# Patient Record
Sex: Female | Born: 1995 | Race: White | Hispanic: No | Marital: Married | Smoking: Never smoker
Health system: Southern US, Community
[De-identification: ages and names within clinical notes are randomized; demographics above are authoritative.]

## PROBLEM LIST (undated history)

## (undated) ENCOUNTER — Inpatient Hospital Stay (HOSPITAL_COMMUNITY): Payer: Self-pay

## (undated) DIAGNOSIS — F419 Anxiety disorder, unspecified: Secondary | ICD-10-CM

## (undated) DIAGNOSIS — M419 Scoliosis, unspecified: Secondary | ICD-10-CM

## (undated) DIAGNOSIS — T7840XA Allergy, unspecified, initial encounter: Secondary | ICD-10-CM

## (undated) HISTORY — PX: INCISION AND DRAINAGE: SHX5863

## (undated) HISTORY — PX: OTHER SURGICAL HISTORY: SHX169

## (undated) HISTORY — DX: Allergy, unspecified, initial encounter: T78.40XA

## (undated) HISTORY — PX: BACK SURGERY: SHX140

---
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
## 2015-05-21 ENCOUNTER — Encounter (HOSPITAL_COMMUNITY): Payer: Self-pay | Admitting: *Deleted

## 2015-05-21 ENCOUNTER — Inpatient Hospital Stay (HOSPITAL_COMMUNITY)
Admission: AD | Admit: 2015-05-21 | Discharge: 2015-05-22 | Disposition: A | Discharge status: Home / Self Care | Payer: 59 | Source: Ambulatory Visit | Attending: Obstetrics and Gynecology | Admitting: Obstetrics and Gynecology

## 2015-05-21 DIAGNOSIS — N760 Acute vaginitis: Secondary | ICD-10-CM | POA: Diagnosis not present

## 2015-05-21 DIAGNOSIS — Z3A01 Less than 8 weeks gestation of pregnancy: Secondary | ICD-10-CM | POA: Diagnosis not present

## 2015-05-21 DIAGNOSIS — B373 Candidiasis of vulva and vagina: Secondary | ICD-10-CM | POA: Diagnosis not present

## 2015-05-21 DIAGNOSIS — B9689 Other specified bacterial agents as the cause of diseases classified elsewhere: Secondary | ICD-10-CM | POA: Insufficient documentation

## 2015-05-21 DIAGNOSIS — R109 Unspecified abdominal pain: Secondary | ICD-10-CM | POA: Insufficient documentation

## 2015-05-21 DIAGNOSIS — O98811 Other maternal infectious and parasitic diseases complicating pregnancy, first trimester: Secondary | ICD-10-CM | POA: Insufficient documentation

## 2015-05-21 DIAGNOSIS — O26899 Other specified pregnancy related conditions, unspecified trimester: Secondary | ICD-10-CM

## 2015-05-21 DIAGNOSIS — O23591 Infection of other part of genital tract in pregnancy, first trimester: Secondary | ICD-10-CM | POA: Diagnosis not present

## 2015-05-21 HISTORY — DX: Scoliosis, unspecified: M41.9

## 2015-05-21 HISTORY — DX: Anxiety disorder, unspecified: F41.9

## 2015-05-21 LAB — POCT PREGNANCY, URINE: Preg Test, Ur: POSITIVE — AB

## 2015-05-21 NOTE — MAU Note (Signed)
PT  SAYS SHE STARTED  CRAMPING  ON 6-4     AND  FEELS  SAME .  TOOK TYLENOL-  AT  430 PM  2  TAB  500 MG.-   WITH  RELIEF-  FOR  FEW HRS.       WENT  TO OFFICE ON 6-9-  TO CONFIRM  PREG-   AND  NEXT  APPOINTMENT   IS 6-23.   U/S  FOR  6-23.    LAST   SEX-    6-2   .  NO VAG  BLEEDING.

## 2015-05-21 NOTE — MAU Provider Note (Signed)
History     CSN: [id number]  Arrival date and time: 05/21/15 2317   First Provider Initiated Contact with Patient 05/21/15 2354      No chief complaint on file.  HPI  Ms. DALEYZA GADOMSKI is a 19 y.o. G1P0 at 29w2dwho presents to MAU today with complaint of abdominal pain. The patient states she has had cramping in the lower abdomen since 05/11/15. She denies vaginal bleeding, fever or UTI symptoms. She has had nausea and constipation, but denies vomiting or diarrhea. She endorses a creamy white discharge that has increased in amount recently. She has been taking Tylenol with relief.   OB History    Gravida Para Term Preterm AB TAB SAB Ectopic Multiple Living   1               Past Medical History  Diagnosis Date  . Allergy   . Scoliosis   . Anxiety     Past Surgical History  Procedure Laterality Date  . Scoliosis-harrington rods    . Back surgery    . Incision and drainage      Family History  Problem Relation Age of Onset  . Crohn's disease Mother     History  Substance Use Topics  . Smoking status: Never Smoker   . Smokeless tobacco: Not on file  . Alcohol Use: No    Allergies:  Allergies  Allergen Reactions  . Codeine Rash    Prescriptions prior to admission  Medication Sig Dispense Refill Last Dose  . acetaminophen (TYLENOL) 325 MG tablet Take 650 mg by mouth every 6 (six) hours as needed.   05/21/2015 at 1630  . calcium carbonate (TUMS - DOSED IN MG ELEMENTAL CALCIUM) 500 MG chewable tablet Chew 1 tablet by mouth daily.   05/21/2015 at Unknown time  . sertraline (ZOLOFT) 50 MG tablet Take 50 mg by mouth daily.   Past Month at Unknown time  . etonogestrel (IMPLANON) 68 MG IMPL implant Inject 1 each into the skin once.     .Marland Kitchenipratropium (ATROVENT) 0.03 % nasal spray Place 2 sprays into the nose 2 (two) times daily. 30 mL 0   . PredniSONE 5 MG KIT Take 1 kit (5 mg total) by mouth daily after breakfast. Prednisone 5 mg 6 day dosepack.  Take as directed. 1 kit  0     Review of Systems  Constitutional: Negative for fever and malaise/fatigue.  Gastrointestinal: Positive for abdominal pain. Negative for nausea, vomiting, diarrhea and constipation.  Genitourinary: Negative for dysuria, urgency and frequency.       Neg - vaginal bleeding + vaginal discharge   Physical Exam   Blood pressure 125/66, pulse 76, temperature 97.7 F (36.5 C), temperature source Oral, resp. rate 18, height 5' 1"  (1.549 m), weight 158 lb (71.668 kg), last menstrual period 04/08/2015.  Physical Exam  Nursing note and vitals reviewed. Constitutional: She is oriented to person, place, and time. She appears well-developed and well-nourished. No distress.  HENT:  Head: Normocephalic and atraumatic.  Cardiovascular: Normal rate.   Respiratory: Effort normal.  GI: Soft.  Genitourinary: Uterus is tender (mild). Uterus is not enlarged. Cervix exhibits no motion tenderness, no discharge and no friability. Right adnexum displays tenderness (mild to moderate). Right adnexum displays no mass. Left adnexum displays no mass and no tenderness. No bleeding in the vagina. Vaginal discharge (moderate amount of thick, white discharge noted) found.  Neurological: She is alert and oriented to person, place, and time.  Skin: Skin  is warm and dry. No erythema.  Psychiatric: She has a normal mood and affect.   Results for orders placed or performed during the hospital encounter of 05/21/15 (from the past 24 hour(s))  Urinalysis, Routine w reflex microscopic (not at [facility])     Status: None   Collection Time: 05/21/15 11:45 PM  Result Value Ref Range   Color, Urine YELLOW YELLOW   APPearance CLEAR CLEAR   Specific Gravity, Urine 1.020 1.005 - 1.030   pH 6.0 5.0 - 8.0   Glucose, UA NEGATIVE NEGATIVE mg/dL   Hgb urine dipstick NEGATIVE NEGATIVE   Bilirubin Urine NEGATIVE NEGATIVE   Ketones, ur NEGATIVE NEGATIVE mg/dL   Protein, ur NEGATIVE NEGATIVE mg/dL   Urobilinogen, UA 0.2 0.0 - 1.0  mg/dL   Nitrite NEGATIVE NEGATIVE   Leukocytes, UA NEGATIVE NEGATIVE  Pregnancy, urine POC     Status: Abnormal   Collection Time: 05/21/15 11:49 PM  Result Value Ref Range   Preg Test, Ur POSITIVE (A) NEGATIVE  CBC with Differential/Platelet     Status: Abnormal   Collection Time: 05/22/15 12:00 AM  Result Value Ref Range   WBC 9.9 4.0 - 10.5 K/uL   RBC 4.53 3.87 - 5.11 MIL/uL   Hemoglobin 14.0 12.0 - 15.0 g/dL   HCT 38.2 36.0 - 46.0 %   MCV 84.3 78.0 - 100.0 fL   MCH 30.9 26.0 - 34.0 pg   MCHC 36.6 (H) 30.0 - 36.0 g/dL   RDW 12.2 11.5 - 15.5 %   Platelets 224 150 - 400 K/uL   Neutrophils Relative % 51 43 - 77 %   Lymphocytes Relative 40 12 - 46 %   Monocytes Relative 8 3 - 12 %   Eosinophils Relative 1 0 - 5 %   Basophils Relative 0 0 - 1 %   Band Neutrophils 0 0 - 10 %   Metamyelocytes Relative 0 %   Myelocytes 0 %   Promyelocytes Absolute 0 %   Blasts 0 %   nRBC 0 0 /100 WBC   Other 0 %   Neutro Abs 5.0 1.7 - 7.7 K/uL   Lymphs Abs 4.0 0.7 - 4.0 K/uL   Monocytes Absolute 0.8 0.1 - 1.0 K/uL   Eosinophils Absolute 0.1 0.0 - 0.7 K/uL   Basophils Absolute 0.0 0.0 - 0.1 K/uL  ABO/Rh     Status: None   Collection Time: 05/22/15 12:00 AM  Result Value Ref Range   ABO/RH(D) B POS   hCG, quantitative, pregnancy     Status: Abnormal   Collection Time: 05/22/15 12:00 AM  Result Value Ref Range   hCG, Beta Chain, Quant, S 24421 (H) <5 mIU/mL  Wet prep, genital     Status: Abnormal   Collection Time: 05/22/15 12:33 AM  Result Value Ref Range   Yeast Wet Prep HPF POC NONE SEEN NONE SEEN   Trich, Wet Prep NONE SEEN NONE SEEN   Clue Cells Wet Prep HPF POC FEW (A) NONE SEEN   WBC, Wet Prep HPF POC FEW (A) NONE SEEN   US Ob Comp Less 14 Wks  05/22/2015   CLINICAL DATA:  Acute onset of bilateral pelvic pain and cramping. Initial encounter.  EXAM: OBSTETRIC <14 WK [place] AND TRANSVAGINAL OB US  TECHNIQUE: Both transabdominal and transvaginal ultrasound examinations were performed for  complete evaluation of the gestation as well as the maternal uterus, adnexal regions, and pelvic cul-de-sac. Transvaginal technique was performed to assess early pregnancy.  COMPARISON:  CT of the abdomen and pelvis performed 07/11/2008  FINDINGS: Intrauterine gestational sac: Visualized/normal in shape.  Yolk sac:  Yes  Embryo:  No  Cardiac Activity: N/A  MSD: 1.3 cm   6 w   1  d  Maternal uterus/adnexae: No subchorionic hemorrhage is noted. The uterus is otherwise unremarkable.  The ovaries are within normal limits, though the right ovary is not fully measured on this study. The left ovary measures 3.5 x 2.8 x 2.9 cm. No suspicious adnexal masses are seen. There is no evidence for ovarian torsion.  Trace free fluid is seen within the pelvic cul-de-sac.  IMPRESSION: Single intrauterine gestational sac noted, with a mean sac diameter of 1.3 cm, corresponding to a gestational age of [redacted] weeks 1 day. This matches the gestational age of [redacted] weeks 2 days by LMP, reflecting an estimated date of delivery of January 13, 2016. A yolk sac is seen. The embryo is not yet visualized, within normal limits.   Electronically Signed   By: Garald Balding M.D.   On: 05/22/2015 01:22   US Ob Transvaginal  05/22/2015   CLINICAL DATA:  Acute onset of bilateral pelvic pain and cramping. Initial encounter.  EXAM: OBSTETRIC <14 WK [place] AND TRANSVAGINAL OB US  TECHNIQUE: Both transabdominal and transvaginal ultrasound examinations were performed for complete evaluation of the gestation as well as the maternal uterus, adnexal regions, and pelvic cul-de-sac. Transvaginal technique was performed to assess early pregnancy.  COMPARISON:  CT of the abdomen and pelvis performed 07/11/2008  FINDINGS: Intrauterine gestational sac: Visualized/normal in shape.  Yolk sac:  Yes  Embryo:  No  Cardiac Activity: N/A  MSD: 1.3 cm   6 w   1  d  Maternal uterus/adnexae: No subchorionic hemorrhage is noted. The uterus is otherwise unremarkable.  The ovaries are  within normal limits, though the right ovary is not fully measured on this study. The left ovary measures 3.5 x 2.8 x 2.9 cm. No suspicious adnexal masses are seen. There is no evidence for ovarian torsion.  Trace free fluid is seen within the pelvic cul-de-sac.  IMPRESSION: Single intrauterine gestational sac noted, with a mean sac diameter of 1.3 cm, corresponding to a gestational age of [redacted] weeks 1 day. This matches the gestational age of [redacted] weeks 2 days by LMP, reflecting an estimated date of delivery of January 13, 2016. A yolk sac is seen. The embryo is not yet visualized, within normal limits.   Electronically Signed   By: Garald Balding M.D.   On: 05/22/2015 01:22     MAU Course  Procedures None  MDM +UPT UA, wet prep, GC/chlamydia, CBC, ABO/Rh, quant hCG, HIV, RPR and [place] today to rule out ectopic pregnancy Discussed patient with Dr. Rogue Bussing. Agrees with plan for ectopic work-up and follow-up in the office as scheduled.  150 gm Diflucan and 1000 mg Tylenol given in MAU Assessment and Plan  A: IUGS and YS at 28w1dAbdominal pain in pregnancy, antepartum Yeast vulvovaginitis, clinical Bacterial vaginosis  P: Discharge home Rx for Flagyl given to patient Tylenol PRN for pain First trimester precautions discussed [facility] for routine prenatal care Patient may return to MAU as needed or if her condition were to change or worsen   JLuvenia Redden PA-C  05/22/2015, 2:15 AM

## 2015-05-22 ENCOUNTER — Encounter (HOSPITAL_COMMUNITY): Payer: Self-pay

## 2015-05-22 ENCOUNTER — Inpatient Hospital Stay (HOSPITAL_COMMUNITY): Payer: 59

## 2015-05-22 DIAGNOSIS — A499 Bacterial infection, unspecified: Secondary | ICD-10-CM

## 2015-05-22 DIAGNOSIS — N760 Acute vaginitis: Secondary | ICD-10-CM

## 2015-05-22 LAB — CBC WITH DIFFERENTIAL/PLATELET
BASOS PCT: 0 % (ref 0–1)
Band Neutrophils: 0 % (ref 0–10)
Basophils Absolute: 0 10*3/uL (ref 0.0–0.1)
Blasts: 0 %
EOS ABS: 0.1 10*3/uL (ref 0.0–0.7)
EOS PCT: 1 % (ref 0–5)
HEMATOCRIT: 38.2 % (ref 36.0–46.0)
Hemoglobin: 14 g/dL (ref 12.0–15.0)
LYMPHS PCT: 40 % (ref 12–46)
Lymphs Abs: 4 10*3/uL (ref 0.7–4.0)
MCH: 30.9 pg (ref 26.0–34.0)
MCHC: 36.6 g/dL — ABNORMAL HIGH (ref 30.0–36.0)
MCV: 84.3 fL (ref 78.0–100.0)
Metamyelocytes Relative: 0 %
Monocytes Absolute: 0.8 10*3/uL (ref 0.1–1.0)
Monocytes Relative: 8 % (ref 3–12)
Myelocytes: 0 %
NEUTROS ABS: 5 10*3/uL (ref 1.7–7.7)
NEUTROS PCT: 51 % (ref 43–77)
OTHER: 0 %
Platelets: 224 10*3/uL (ref 150–400)
Promyelocytes Absolute: 0 %
RBC: 4.53 MIL/uL (ref 3.87–5.11)
RDW: 12.2 % (ref 11.5–15.5)
WBC: 9.9 10*3/uL (ref 4.0–10.5)
nRBC: 0 /100 WBC

## 2015-05-22 LAB — URINALYSIS, ROUTINE W REFLEX MICROSCOPIC
BILIRUBIN URINE: NEGATIVE
GLUCOSE, UA: NEGATIVE mg/dL
HGB URINE DIPSTICK: NEGATIVE
KETONES UR: NEGATIVE mg/dL
Leukocytes, UA: NEGATIVE
Nitrite: NEGATIVE
PH: 6 (ref 5.0–8.0)
Protein, ur: NEGATIVE mg/dL
Specific Gravity, Urine: 1.02 (ref 1.005–1.030)
Urobilinogen, UA: 0.2 mg/dL (ref 0.0–1.0)

## 2015-05-22 LAB — WET PREP, GENITAL
Trich, Wet Prep: NONE SEEN
Yeast Wet Prep HPF POC: NONE SEEN

## 2015-05-22 LAB — RPR: RPR Ser Ql: NONREACTIVE

## 2015-05-22 LAB — ABO/RH: ABO/RH(D): B POS

## 2015-05-22 LAB — HIV ANTIBODY (ROUTINE TESTING W REFLEX): HIV Screen 4th Generation wRfx: NONREACTIVE

## 2015-05-22 LAB — HCG, QUANTITATIVE, PREGNANCY: HCG, BETA CHAIN, QUANT, S: 24421 m[IU]/mL — AB (ref <5)

## 2015-05-22 MED ORDER — FLUCONAZOLE 150 MG PO TABS
150.0000 mg | ORAL_TABLET | Freq: Once | ORAL | Status: AC
  Administered 2015-05-22: 150 mg via ORAL
  Filled 2015-05-22: qty 1

## 2015-05-22 MED ORDER — METRONIDAZOLE 500 MG PO TABS: 500.0000 mg | ORAL_TABLET | Freq: Two times a day (BID) | ORAL | Status: DC

## 2015-05-22 MED ORDER — ACETAMINOPHEN 500 MG PO TABS
1000.0000 mg | ORAL_TABLET | Freq: Once | ORAL | Status: AC
  Administered 2015-05-22: 1000 mg via ORAL
  Filled 2015-05-22: qty 2

## 2015-05-22 NOTE — Discharge Instructions (Signed)
First Trimester of Pregnancy The first trimester of pregnancy is from week 1 until the end of week 12 (months 1 through 3). During this time, your baby will begin to develop inside you. At 6-8 weeks, the eyes and face are formed, and the heartbeat can be seen on ultrasound. At the end of 12 weeks, all the baby's organs are formed. Prenatal care is all the medical care you receive before the birth of your baby. Make sure you get good prenatal care and follow all of your doctor's instructions. HOME CARE  Medicines  Take medicine only as told by your doctor. Some medicines are safe and some are not during pregnancy.  Take your prenatal vitamins as told by your doctor.  Take medicine that helps you poop (stool softener) as needed if your doctor says it is okay. Diet  Eat regular, healthy meals.  Your doctor will tell you the amount of weight gain that is right for you.  Avoid raw meat and uncooked cheese.  If you feel sick to your stomach (nauseous) or throw up (vomit):  Eat 4 or 5 small meals a day instead of 3 large meals.  Try eating a few soda crackers.  Drink liquids between meals instead of during meals.  If you have a hard time pooping (constipation):  Eat high-fiber foods like fresh vegetables, fruit, and whole grains.  Drink enough fluids to keep your pee (urine) clear or pale yellow. Activity and Exercise  Exercise only as told by your doctor. Stop exercising if you have cramps or pain in your lower belly (abdomen) or low back.  Try to avoid standing for long periods of time. Move your legs often if you must stand in one place for a long time.  Avoid heavy lifting.  Wear low-heeled shoes. Sit and stand up straight.  You can have sex unless your doctor tells you not to. Relief of Pain or Discomfort  Wear a good support bra if your breasts are sore.  Take warm water baths (sitz baths) to soothe pain or discomfort caused by hemorrhoids. Use hemorrhoid cream if your  doctor says it is okay.  Rest with your legs raised if you have leg cramps or low back pain.  Wear support hose if you have puffy, bulging veins (varicose veins) in your legs. Raise (elevate) your feet for 15 minutes, 3-4 times a day. Limit salt in your diet. Prenatal Care  Schedule your prenatal visits by the twelfth week of pregnancy.  Write down your questions. Take them to your prenatal visits.  Keep all your prenatal visits as told by your doctor. Safety  Wear your seat belt at all times when driving.  Make a list of emergency phone numbers. The list should include numbers for family, friends, the hospital, and police and fire departments. General Tips  Ask your doctor for a referral to a local prenatal class. Begin classes no later than at the start of month 6 of your pregnancy.  Ask for help if you need counseling or help with nutrition. Your doctor can give you advice or tell you where to go for help.  Do not use hot tubs, steam rooms, or saunas.  Do not douche or use tampons or scented sanitary pads.  Do not cross your legs for long periods of time.  Avoid litter boxes and soil used by cats.  Avoid all smoking, herbs, and alcohol. Avoid drugs not approved by your doctor.  Visit your dentist. At home, brush your teeth   with a soft toothbrush. Be gentle when you floss. GET HELP IF:  You are dizzy.  You have mild cramps or pressure in your lower belly.  You have a nagging pain in your belly area.  You continue to feel sick to your stomach, throw up, or have watery poop (diarrhea).  You have a bad smelling fluid coming from your vagina.  You have pain with peeing (urination).  You have increased puffiness (swelling) in your face, hands, legs, or ankles. GET HELP RIGHT AWAY IF:   You have a fever.  You are leaking fluid from your vagina.  You have spotting or bleeding from your vagina.  You have very bad belly cramping or pain.  You gain or lose weight  rapidly.  You throw up blood. It may look like coffee grounds.  You are around people who have German measles, fifth disease, or chickenpox.  You have a very bad headache.  You have shortness of breath.  You have any kind of trauma, such as from a fall or a car accident. Document Released: 05/11/2008 Document Revised: 04/09/2014 Document Reviewed: 10/03/2013 Exit[employer] Patient Information 2015 [employer]. This information is not intended to replace advice given to you by your health care provider. Make sure you discuss any questions you have with your health care provider.  

## 2015-05-23 LAB — GC/CHLAMYDIA PROBE AMP (~~LOC~~) NOT AT ARMC
CHLAMYDIA, DNA PROBE: NEGATIVE
NEISSERIA GONORRHEA: NEGATIVE

## 2015-07-01 LAB — OB RESULTS CONSOLE RUBELLA ANTIBODY, IGM: RUBELLA: IMMUNE

## 2015-07-01 LAB — OB RESULTS CONSOLE ANTIBODY SCREEN: ANTIBODY SCREEN: NEGATIVE

## 2015-07-01 LAB — OB RESULTS CONSOLE RPR: RPR: NONREACTIVE

## 2015-07-01 LAB — OB RESULTS CONSOLE GC/CHLAMYDIA
CHLAMYDIA, DNA PROBE: NEGATIVE
Gonorrhea: NEGATIVE

## 2015-07-01 LAB — OB RESULTS CONSOLE HEPATITIS B SURFACE ANTIGEN: HEP B S AG: NEGATIVE

## 2015-07-01 LAB — OB RESULTS CONSOLE ABO/RH: RH Type: POSITIVE

## 2015-07-01 LAB — OB RESULTS CONSOLE HIV ANTIBODY (ROUTINE TESTING): HIV: NONREACTIVE

## 2015-11-06 ENCOUNTER — Encounter: Payer: Managed Care, Other (non HMO) | Attending: Obstetrics

## 2015-11-06 VITALS — Ht 64.0 in | Wt 173.3 lb

## 2015-11-06 DIAGNOSIS — O24419 Gestational diabetes mellitus in pregnancy, unspecified control: Secondary | ICD-10-CM | POA: Diagnosis not present

## 2015-11-06 DIAGNOSIS — Z713 Dietary counseling and surveillance: Secondary | ICD-10-CM | POA: Diagnosis not present

## 2015-11-07 NOTE — Progress Notes (Signed)
  Patient was seen on 11/06/15 for Gestational Diabetes self-management . The following learning objectives were met by the patient :   States the definition of Gestational Diabetes  States why dietary management is important in controlling blood glucose  Describes the effects of carbohydrates on blood glucose levels  Demonstrates ability to create a balanced meal plan  Demonstrates carbohydrate counting   States when to check blood glucose levels  Demonstrates proper blood glucose monitoring techniques  States the effect of stress and exercise on blood glucose levels  States the importance of limiting caffeine and abstaining from alcohol and smoking  Plan:  Aim for 2 Carb Choices per meal (30 grams) +/- 1 either way for breakfast Aim for 3 Carb Choices per meal (45 grams) +/- 1 either way from lunch and dinner Aim for 1-2 Carbs per snack Begin reading food labels for Total Carbohydrate and sugar grams of foods Consider  increasing your activity level by walking daily as tolerated Begin checking BG before breakfast and 1-2 hours after first bit of breakfast, lunch and dinner after  as directed by MD  Take medication  as directed by MD  Blood glucose monitor given: One Touch Verio Flex Lot # [id number] X Exp: 11/2016  Patient instructed to monitor glucose levels: FBS: 60 - <90 1 hour: <140 2 hour: <120  Patient received the following handouts:  Nutrition Diabetes and Pregnancy  Carbohydrate Counting List  Meal Planning worksheet  Patient will be seen for follow-up as needed.

## 2015-12-08 NOTE — L&D Delivery Note (Signed)
Patient was C/C/+2 and pushed for 2hr 40 minutes with epidural.   NSVD female infant, Apgars 8/9, light mec, weight pending. The patient had a left mediolateral episiotomy repaired with 2-0 vicryl and right labial laceration repaired with 3-0 vicryl. Fundus was firm. EBL was expected amount. Placenta was delivered intact. Vagina was clear.  Baby was vigorous and doing skin to skin with mother.  Philip Aspen

## 2015-12-13 ENCOUNTER — Other Ambulatory Visit: Payer: Self-pay | Admitting: Obstetrics and Gynecology

## 2015-12-13 LAB — OB RESULTS CONSOLE GBS: GBS: NEGATIVE

## 2015-12-27 ENCOUNTER — Other Ambulatory Visit: Payer: Self-pay | Admitting: Obstetrics

## 2016-01-03 ENCOUNTER — Encounter (HOSPITAL_COMMUNITY): Payer: Self-pay | Admitting: *Deleted

## 2016-01-03 ENCOUNTER — Telehealth (HOSPITAL_COMMUNITY): Payer: Self-pay | Admitting: *Deleted

## 2016-01-03 NOTE — Telephone Encounter (Signed)
Preadmission screen  

## 2016-01-07 ENCOUNTER — Encounter (HOSPITAL_COMMUNITY): Payer: Self-pay

## 2016-01-07 ENCOUNTER — Inpatient Hospital Stay (HOSPITAL_COMMUNITY): Payer: Managed Care, Other (non HMO) | Admitting: Anesthesiology

## 2016-01-07 ENCOUNTER — Inpatient Hospital Stay (HOSPITAL_COMMUNITY)
Admission: RE | Admit: 2016-01-07 | Discharge: 2016-01-10 | DRG: 775 | Disposition: A | Discharge status: Home / Self Care | Payer: Managed Care, Other (non HMO) | Source: Ambulatory Visit | Attending: Obstetrics | Admitting: Obstetrics

## 2016-01-07 DIAGNOSIS — Z3A39 39 weeks gestation of pregnancy: Secondary | ICD-10-CM | POA: Diagnosis not present

## 2016-01-07 DIAGNOSIS — O24425 Gestational diabetes mellitus in childbirth, controlled by oral hypoglycemic drugs: Principal | ICD-10-CM | POA: Diagnosis present

## 2016-01-07 DIAGNOSIS — M419 Scoliosis, unspecified: Secondary | ICD-10-CM | POA: Diagnosis present

## 2016-01-07 DIAGNOSIS — O24415 Gestational diabetes mellitus in pregnancy, controlled by oral hypoglycemic drugs: Secondary | ICD-10-CM | POA: Diagnosis present

## 2016-01-07 LAB — CBC
HCT: 36.2 % (ref 36.0–46.0)
HEMATOCRIT: 36 % (ref 36.0–46.0)
HEMOGLOBIN: 12.7 g/dL (ref 12.0–15.0)
Hemoglobin: 12.3 g/dL (ref 12.0–15.0)
MCH: 30 pg (ref 26.0–34.0)
MCH: 29.4 pg (ref 26.0–34.0)
MCHC: 35.1 g/dL (ref 30.0–36.0)
MCHC: 34.2 g/dL (ref 30.0–36.0)
MCV: 85.6 fL (ref 78.0–100.0)
MCV: 86.1 fL (ref 78.0–100.0)
PLATELETS: 141 10*3/uL — AB (ref 150–400)
Platelets: 162 10*3/uL (ref 150–400)
RBC: 4.23 MIL/uL (ref 3.87–5.11)
RBC: 4.18 MIL/uL (ref 3.87–5.11)
RDW: 12.7 % (ref 11.5–15.5)
RDW: 12.7 % (ref 11.5–15.5)
WBC: 9.3 10*3/uL (ref 4.0–10.5)
WBC: 10.5 10*3/uL (ref 4.0–10.5)

## 2016-01-07 LAB — GLUCOSE, CAPILLARY
GLUCOSE-CAPILLARY: 89 mg/dL (ref 65–99)
GLUCOSE-CAPILLARY: 55 mg/dL — AB (ref 65–99)
GLUCOSE-CAPILLARY: 101 mg/dL — AB (ref 65–99)
GLUCOSE-CAPILLARY: 69 mg/dL (ref 65–99)
Glucose-Capillary: 65 mg/dL (ref 65–99)
Glucose-Capillary: 87 mg/dL (ref 65–99)

## 2016-01-07 LAB — COMPREHENSIVE METABOLIC PANEL
ALBUMIN: 2.8 g/dL — AB (ref 3.5–5.0)
ALT: 15 U/L (ref 14–54)
ANION GAP: 8 (ref 5–15)
AST: 21 U/L (ref 15–41)
Alkaline Phosphatase: 122 U/L (ref 38–126)
BILIRUBIN TOTAL: 0.6 mg/dL (ref 0.3–1.2)
BUN: 8 mg/dL (ref 6–20)
CO2: 23 mmol/L (ref 22–32)
Calcium: 9 mg/dL (ref 8.9–10.3)
Chloride: 107 mmol/L (ref 101–111)
Creatinine, Ser: 0.69 mg/dL (ref 0.44–1.00)
GFR calc Af Amer: >60 mL/min (ref >60)
GFR calc non Af Amer: >60 mL/min (ref >60)
GLUCOSE: 83 mg/dL (ref 65–99)
POTASSIUM: 3.9 mmol/L (ref 3.5–5.1)
Sodium: 138 mmol/L (ref 135–145)
TOTAL PROTEIN: 6.3 g/dL — AB (ref 6.5–8.1)

## 2016-01-07 LAB — TYPE AND SCREEN
ABO/RH(D): B POS
ANTIBODY SCREEN: NEGATIVE

## 2016-01-07 LAB — GLUCOSE, RANDOM: Glucose, Bld: 122 mg/dL — ABNORMAL HIGH (ref 65–99)

## 2016-01-07 LAB — RPR: RPR: NONREACTIVE

## 2016-01-07 LAB — MRSA PCR SCREENING: MRSA by PCR: NEGATIVE

## 2016-01-07 MED ORDER — LACTATED RINGERS IV SOLN
INTRAVENOUS | Status: DC
Start: 2016-01-07 — End: 2016-01-08
  Administered 2016-01-07: 18:00:00 via INTRAUTERINE

## 2016-01-07 MED ORDER — FENTANYL 2.5 MCG/ML BUPIVACAINE 1/10 % EPIDURAL INFUSION (WH - ANES)
14.0000 mL/h | INTRAMUSCULAR | Status: DC | PRN
  Administered 2016-01-07 (×2): 14 mL/h via EPIDURAL
  Filled 2016-01-07 (×2): qty 125

## 2016-01-07 MED ORDER — LACTATED RINGERS IV SOLN
INTRAVENOUS | Status: DC
  Administered 2016-01-07 – 2016-01-08 (×2): via INTRAVENOUS

## 2016-01-07 MED ORDER — LIDOCAINE HCL (PF) 1 % IJ SOLN
INTRAMUSCULAR | Status: DC | PRN
  Administered 2016-01-07: 5 mL via EPIDURAL
  Administered 2016-01-07: 9 mL via EPIDURAL

## 2016-01-07 MED ORDER — CITRIC ACID-SODIUM CITRATE 334-500 MG/5ML PO SOLN
30.0000 mL | ORAL | Status: DC | PRN
  Administered 2016-01-08: 30 mL via ORAL
  Filled 2016-01-07: qty 15

## 2016-01-07 MED ORDER — PHENYLEPHRINE 40 MCG/ML (10ML) SYRINGE FOR IV PUSH (FOR BLOOD PRESSURE SUPPORT)
80.0000 ug | PREFILLED_SYRINGE | INTRAVENOUS | Status: DC | PRN
  Filled 2016-01-07: qty 2
  Filled 2016-01-07: qty 20

## 2016-01-07 MED ORDER — OXYTOCIN 10 UNIT/ML IJ SOLN
2.5000 [IU]/h | INTRAVENOUS | Status: DC
  Filled 2016-01-07: qty 4

## 2016-01-07 MED ORDER — LACTATED RINGERS IV SOLN
500.0000 mL | INTRAVENOUS | Status: DC | PRN
  Administered 2016-01-07: 500 mL via INTRAVENOUS
  Administered 2016-01-07: 1000 mL via INTRAVENOUS
  Administered 2016-01-07: 500 mL via INTRAVENOUS

## 2016-01-07 MED ORDER — DIPHENHYDRAMINE HCL 50 MG/ML IJ SOLN: 12.5000 mg | INTRAMUSCULAR | Status: DC | PRN

## 2016-01-07 MED ORDER — ACETAMINOPHEN 325 MG PO TABS: 650.0000 mg | ORAL_TABLET | ORAL | Status: DC | PRN

## 2016-01-07 MED ORDER — OXYTOCIN 10 UNIT/ML IJ SOLN
1.0000 m[IU]/min | INTRAMUSCULAR | Status: DC
  Administered 2016-01-07: 2 m[IU]/min via INTRAVENOUS

## 2016-01-07 MED ORDER — ONDANSETRON HCL 4 MG/2ML IJ SOLN
4.0000 mg | Freq: Four times a day (QID) | INTRAMUSCULAR | Status: DC | PRN
  Administered 2016-01-08: 4 mg via INTRAVENOUS
  Filled 2016-01-07: qty 2

## 2016-01-07 MED ORDER — MISOPROSTOL 25 MCG QUARTER TABLET
25.0000 ug | ORAL_TABLET | ORAL | Status: DC | PRN
  Administered 2016-01-07 (×2): 25 ug via VAGINAL
  Filled 2016-01-07 (×2): qty 0.25
  Filled 2016-01-07: qty 1

## 2016-01-07 MED ORDER — OXYTOCIN BOLUS FROM INFUSION: 500.0000 mL | INTRAVENOUS | Status: DC

## 2016-01-07 MED ORDER — BUTORPHANOL TARTRATE 1 MG/ML IJ SOLN: 1.0000 mg | INTRAMUSCULAR | Status: DC | PRN

## 2016-01-07 MED ORDER — LIDOCAINE HCL (PF) 1 % IJ SOLN
30.0000 mL | INTRAMUSCULAR | Status: DC | PRN
  Administered 2016-01-08: 30 mL via SUBCUTANEOUS
  Filled 2016-01-07: qty 30

## 2016-01-07 MED ORDER — EPHEDRINE 5 MG/ML INJ
10.0000 mg | INTRAVENOUS | Status: DC | PRN
  Filled 2016-01-07: qty 2

## 2016-01-07 MED ORDER — TERBUTALINE SULFATE 1 MG/ML IJ SOLN
0.2500 mg | Freq: Once | INTRAMUSCULAR | Status: DC | PRN
Start: 2016-01-07 — End: 2016-01-08
  Filled 2016-01-07: qty 1

## 2016-01-07 MED ORDER — TERBUTALINE SULFATE 1 MG/ML IJ SOLN
0.2500 mg | Freq: Once | INTRAMUSCULAR | Status: DC | PRN
  Filled 2016-01-07: qty 1

## 2016-01-07 NOTE — Progress Notes (Signed)
Patient's CBG was 65 at 0847. Two apple juices given; rechecked: 89. Encouraged patient to order regularly from the clear liquid menu

## 2016-01-07 NOTE — Progress Notes (Addendum)
Pt comfortable s/p epidural. FHT: 135 mod var with several episodes of prolonged decels requiring d/c of pitocin, currently baseline approx 135 with mod var and accels, still with variable decels intermittently.   TOCO: irregular ctx pattern with 3q 1 min followed by several minutes of no ctx. SVE: def - check approx 1 hr ago progressed to 3.5 but head still high A/P: advised RN to await reactive strip and attempt to restart pitocin when reassuring Will continue augmentation as possible. Repeat CBC with added CMP ordered.

## 2016-01-07 NOTE — H&P (Signed)
20 y.o. [redacted]w[redacted]d  G1P0 comes in for schedule IOL d/t GDMA2, controlled on glyburide 2.5mg  bid.  Otherwise has good fetal movement and no bleeding.  Past Medical History  Diagnosis Date  . Allergy   . Scoliosis   . Anxiety   . Gestational diabetes mellitus (GDM), antepartum   . Gestational diabetes     glyburide    Past Surgical History  Procedure Laterality Date  . Scoliosis-harrington rods    . Back surgery    . Incision and drainage      MRSA related R thumb 2009 and L toe 2003    OB History  Gravida Para Term Preterm AB SAB TAB Ectopic Multiple Living  1             # Outcome Date GA Lbr Len/2nd Weight Sex Delivery Anes PTL Lv  1 Current               Social History   Social History  . Marital Status: Married    Spouse Name: N/A  . Number of Children: N/A  . Years of Education: N/A   Occupational History  . Not on file.   Social History Main Topics  . Smoking status: Never Smoker   . Smokeless tobacco: Never Used  . Alcohol Use: No  . Drug Use: No  . Sexual Activity:    Partners: Male   Other Topics Concern  . Not on file   Social History Narrative   Codeine    Prenatal Transfer Tool  Maternal Diabetes: Yes:  Diabetes Type:  Insulin/Medication controlled Genetic Screening: Normal Maternal Ultrasounds/Referrals: Normal Fetal Ultrasounds or other Referrals:  None Maternal Substance Abuse:  No Significant Maternal Medications:  None Significant Maternal Lab Results: Lab values include: Group B Strep negative  Other PNC: uncomplicated.    Filed Vitals:   01/07/16 0852 01/07/16 0923  BP: 139/86 140/89  Pulse: 91 74  Temp:    Resp: 18 18     Lungs/Cor:  NAD Abdomen:  soft, gravid Ex:  no cords, erythema SVE:  1.5/thick/-3 FHTs:  120, good STV, NST R Toco:  q1-4   A/P   Admit for IOL  Serial monitoring of blood sugar  GBS neg  S/p cytotec, will recheck and continue of switch to pitocin  Mild range BPs noted, and platelets 141.  Will  recheck CBC and CMP prior to epidural     Keo Schirmer

## 2016-01-07 NOTE — Anesthesia Preprocedure Evaluation (Signed)
Anesthesia Evaluation  Patient identified by MRN, date of birth, ID band Patient awake    Reviewed: Allergy & Precautions, H&P , NPO status , Patient's Chart, lab work & pertinent test results  Airway Mallampati: I  TM Distance: >3 FB Neck ROM: full    Dental no notable dental hx.    Pulmonary neg pulmonary ROS,    Pulmonary exam normal        Cardiovascular negative cardio ROS Normal cardiovascular exam     Neuro/Psych negative neurological ROS     GI/Hepatic negative GI ROS, Neg liver ROS,   Endo/Other  diabetes, Gestational, Oral Hypoglycemic Agents  Renal/GU negative Renal ROS     Musculoskeletal Scoliosis with Harrington rods that run fromT3-L1   Abdominal (+) + obese,   Peds  Hematology negative hematology ROS (+)   Anesthesia Other Findings   Reproductive/Obstetrics (+) Pregnancy                             Anesthesia Physical Anesthesia Plan  ASA: II  Anesthesia Plan: Epidural   Post-op Pain Management:    Induction:   Airway Management Planned:   Additional Equipment:   Intra-op Plan:   Post-operative Plan:   Informed Consent: I have reviewed the patients History and Physical, chart, labs and discussed the procedure including the risks, benefits and alternatives for the proposed anesthesia with the patient or authorized representative who has indicated his/her understanding and acceptance.     Plan Discussed with:   Anesthesia Plan Comments:         Anesthesia Quick Evaluation

## 2016-01-07 NOTE — Anesthesia Procedure Notes (Addendum)
Epidural Patient location during procedure: OB Start time: 01/07/2016 4:05 PM End time: 01/07/2016 4:09 PM  Staffing Anesthesiologist: Leilani Able Performed by: anesthesiologist   Preanesthetic Checklist Completed: patient identified, surgical consent, pre-op evaluation, timeout performed, IV checked, risks and benefits discussed and monitors and equipment checked  Epidural Patient position: sitting Prep: site prepped and draped and DuraPrep Patient monitoring: continuous pulse ox and blood pressure Approach: midline Location: L3-L4 Injection technique: LOR air  Needle:  Needle type: Tuohy  Needle gauge: 17 G Needle length: 9 cm and 9 Needle insertion depth: 7 cm Catheter type: closed end flexible Catheter size: 19 Gauge Catheter at skin depth: 12 cm Test dose: negative and Other  Assessment Sensory level: T8 Events: blood not aspirated, injection not painful, no injection resistance, negative IV test and no paresthesia  Additional Notes Pts current pain score is 7 and her goal is a 3. Instructed on use of PCEA if pain score is not 3 or less  at 30 min post epidural.Reason for block:procedure for pain

## 2016-01-08 ENCOUNTER — Encounter (HOSPITAL_COMMUNITY): Payer: Self-pay

## 2016-01-08 LAB — CBC
HEMATOCRIT: 32.6 % — AB (ref 36.0–46.0)
HEMOGLOBIN: 11.3 g/dL — AB (ref 12.0–15.0)
MCH: 29.9 pg (ref 26.0–34.0)
MCHC: 34.7 g/dL (ref 30.0–36.0)
MCV: 86.2 fL (ref 78.0–100.0)
Platelets: 161 10*3/uL (ref 150–400)
RBC: 3.78 MIL/uL — AB (ref 3.87–5.11)
RDW: 12.7 % (ref 11.5–15.5)
WBC: 16.4 10*3/uL — ABNORMAL HIGH (ref 4.0–10.5)

## 2016-01-08 LAB — GLUCOSE, CAPILLARY: Glucose-Capillary: 78 mg/dL (ref 65–99)

## 2016-01-08 MED ORDER — OXYCODONE HCL 5 MG PO TABS: 5.0000 mg | ORAL_TABLET | ORAL | Status: DC | PRN

## 2016-01-08 MED ORDER — ACETAMINOPHEN 325 MG PO TABS
650.0000 mg | ORAL_TABLET | ORAL | Status: DC | PRN
Start: 2016-01-08 — End: 2016-01-10
  Administered 2016-01-08 – 2016-01-10 (×3): 650 mg via ORAL
  Filled 2016-01-08 (×4): qty 2

## 2016-01-08 MED ORDER — TETANUS-DIPHTH-ACELL PERTUSSIS 5-2.5-18.5 LF-MCG/0.5 IM SUSP: 0.5000 mL | Freq: Once | INTRAMUSCULAR | Status: DC

## 2016-01-08 MED ORDER — OXYCODONE HCL 5 MG PO TABS: 10.0000 mg | ORAL_TABLET | ORAL | Status: DC | PRN

## 2016-01-08 MED ORDER — ZOLPIDEM TARTRATE 5 MG PO TABS: 5.0000 mg | ORAL_TABLET | Freq: Every evening | ORAL | Status: DC | PRN

## 2016-01-08 MED ORDER — DIBUCAINE 1 % RE OINT: 1.0000 "application " | TOPICAL_OINTMENT | RECTAL | Status: DC | PRN

## 2016-01-08 MED ORDER — SENNOSIDES-DOCUSATE SODIUM 8.6-50 MG PO TABS
2.0000 | ORAL_TABLET | ORAL | Status: DC
  Administered 2016-01-09 – 2016-01-10 (×2): 2 via ORAL
  Filled 2016-01-08 (×2): qty 2

## 2016-01-08 MED ORDER — DIPHENHYDRAMINE HCL 25 MG PO CAPS: 25.0000 mg | ORAL_CAPSULE | Freq: Four times a day (QID) | ORAL | Status: DC | PRN

## 2016-01-08 MED ORDER — SIMETHICONE 80 MG PO CHEW: 80.0000 mg | CHEWABLE_TABLET | ORAL | Status: DC | PRN

## 2016-01-08 MED ORDER — LACTATED RINGERS IV SOLN: INTRAVENOUS | Status: DC

## 2016-01-08 MED ORDER — ONDANSETRON HCL 4 MG PO TABS
4.0000 mg | ORAL_TABLET | ORAL | Status: DC | PRN
Start: 2016-01-08 — End: 2016-01-10

## 2016-01-08 MED ORDER — LANOLIN HYDROUS EX OINT: TOPICAL_OINTMENT | CUTANEOUS | Status: DC | PRN

## 2016-01-08 MED ORDER — IBUPROFEN 600 MG PO TABS
600.0000 mg | ORAL_TABLET | Freq: Four times a day (QID) | ORAL | Status: DC
  Administered 2016-01-08 – 2016-01-10 (×8): 600 mg via ORAL
  Filled 2016-01-08 (×8): qty 1

## 2016-01-08 MED ORDER — WITCH HAZEL-GLYCERIN EX PADS: 1.0000 "application " | MEDICATED_PAD | CUTANEOUS | Status: DC | PRN

## 2016-01-08 MED ORDER — PRENATAL MULTIVITAMIN CH
1.0000 | ORAL_TABLET | Freq: Every day | ORAL | Status: DC
  Administered 2016-01-08 – 2016-01-09 (×2): 1 via ORAL
  Filled 2016-01-08 (×2): qty 1

## 2016-01-08 MED ORDER — BENZOCAINE-MENTHOL 20-0.5 % EX AERO
1.0000 "application " | INHALATION_SPRAY | CUTANEOUS | Status: DC | PRN
  Administered 2016-01-08 – 2016-01-09 (×2): 1 via TOPICAL
  Filled 2016-01-08 (×2): qty 56

## 2016-01-08 MED ORDER — ONDANSETRON HCL 4 MG/2ML IJ SOLN
4.0000 mg | INTRAMUSCULAR | Status: DC | PRN
Start: 2016-01-08 — End: 2016-01-10

## 2016-01-08 NOTE — Anesthesia Postprocedure Evaluation (Signed)
Anesthesia Post Note  Patient: Lauren Hunter  Procedure(s) Performed: * No procedures listed *  Patient location during evaluation: Mother Baby Anesthesia Type: Epidural Level of consciousness: awake Pain management: satisfactory to patient Vital Signs Assessment: post-procedure vital signs reviewed and stable Respiratory status: spontaneous breathing Cardiovascular status: stable Anesthetic complications: no    Last Vitals:  Filed Vitals:   01/08/16 0800 01/08/16 1146  BP: 132/75 130/76  Pulse: 76 88  Temp: 36.7 C 36.7 C  Resp: 20 18    Last Pain:  Filed Vitals:   01/08/16 1209  PainSc: 3                  Gwynevere Lizana

## 2016-01-08 NOTE — Addendum Note (Signed)
Addendum  created 01/08/16 1604 by Algis Greenhouse, CRNA   Modules edited: Charges VN, Clinical Notes   Clinical Notes:  File: [id number]

## 2016-01-08 NOTE — Progress Notes (Signed)
Post Partum Day 0 Subjective: no complaints, up ad lib, voiding and tolerating PO  Objective: Blood pressure 142/84, pulse 74, temperature 98.3 F (36.8 C), temperature source Oral, resp. rate 18, height  (1.6 m), weight 83.462 kg (184 lb), last menstrual period 04/08/2015, SpO2 99 %, unknown if currently breastfeeding.  Physical Exam:  General: alert, cooperative and appears stated age Lochia: appropriate Uterine Fundus: firm    Recent Labs  01/07/16 1805 01/08/16 0600  HGB 12.3 11.3*  HCT 36.0 32.6*    Assessment/Plan: Breastfeeding  Routine PP care   LOS: 1 day   Vivaan Helseth H. 01/08/2016, 10:55 PM

## 2016-01-08 NOTE — Lactation Note (Signed)
This note was copied from the chart of Lauren Hunter. Lactation Consultation Note Initial visit at 12 hours of age.  Mom reports a few good feedings.  Baby has just latched to left breast.  Mom is awkard holding position and baby pulled off, but still showing feeding cues.  Assisted with cross cradle hold. Baby opens mouth wide, but needs finger to roll out lower lip for flanging.  [facility] LC resources given and discussed.  Encouraged to feed with early cues on demand.  Early newborn behavior discussed.  Hand expression demonstrated by mom with colostrum visible.  Mom to call for assist as needed.     Patient Name: Lauren Lauren Hunter [id number] Date: 01/08/2016 Reason for consult: Initial assessment   Maternal Data Has patient been taught Hand Expression?: Yes Does the patient have breastfeeding experience prior to this delivery?: No  Feeding Feeding Type: Breast Fed Length of feed: 15 min  LATCH Score/Interventions Latch: Repeated attempts needed to sustain latch, nipple held in mouth throughout feeding, stimulation needed to elicit sucking reflex. Intervention(s): Adjust position;Assist with latch;Breast massage;Breast compression  Audible Swallowing: A few with stimulation Intervention(s): Skin to skin;Hand expression  Type of Nipple: Everted at rest and after stimulation  Comfort (Breast/Nipple): Soft / non-tender     Hold (Positioning): Assistance needed to correctly position infant at breast and maintain latch. Intervention(s): Breastfeeding basics reviewed;Support Pillows;Position options;Skin to skin  LATCH Score: 7  Lactation Tools Discussed/Used     Consult Status Consult Status: Follow-up Date: 01/09/16 Follow-up type: In-patient    Jannifer Rodney 01/08/2016, 5:07 PM

## 2016-01-09 LAB — CBC
HEMATOCRIT: 27.6 % — AB (ref 36.0–46.0)
HEMOGLOBIN: 9.5 g/dL — AB (ref 12.0–15.0)
MCH: 30.4 pg (ref 26.0–34.0)
MCHC: 34.4 g/dL (ref 30.0–36.0)
MCV: 88.2 fL (ref 78.0–100.0)
Platelets: 147 10*3/uL — ABNORMAL LOW (ref 150–400)
RBC: 3.13 MIL/uL — AB (ref 3.87–5.11)
RDW: 12.6 % (ref 11.5–15.5)
WBC: 9.8 10*3/uL (ref 4.0–10.5)

## 2016-01-09 MED ORDER — OXYCODONE HCL 10 MG PO TABS: 10.0000 mg | ORAL_TABLET | ORAL | Status: DC | PRN

## 2016-01-09 MED ORDER — SENNOSIDES-DOCUSATE SODIUM 8.6-50 MG PO TABS: 2.0000 | ORAL_TABLET | ORAL | Status: DC

## 2016-01-09 MED ORDER — SIMETHICONE 80 MG PO CHEW: 80.0000 mg | CHEWABLE_TABLET | ORAL | Status: DC | PRN

## 2016-01-09 NOTE — Clinical Social Work Maternal (Signed)
CLINICAL SOCIAL WORK MATERNAL/CHILD NOTE  Patient Details  Name: Lauren Hunter MRN: [id number] Date of Birth: 11-02-1996  Date:  01/09/2016  Clinical Social Worker Initiating Note:  Loleta Books MSW, LCSW Date/ Time Initiated:  01/09/16/0920     Child's Name:  Genelle Bal   Legal Guardian:  Luanna Cole and Vergia Alberts  Need for Interpreter:  None   Date of Referral:  01/08/16     Reason for Referral: History of anxiety  Referral Source:  [facility]   Address:  [address] [place], [place] [address]  Phone number:  [phone]   Household Members:  Spouse   Natural Supports (not living in the home):  Extended Family, Immediate Family   Professional Supports: None   Employment: Homemaker   Type of Work:     Education:      Architect:  Media planner   Other Resources:      Cultural/Religious Considerations Which May Impact Care:  None reported  Strengths:  Ability to meet basic needs , Home prepared for child , Pediatrician chosen    Risk Factors/Current Problems:   1. Mental Health Concerns: MOB presents with a history of anxiety, and reported discontinuing Zoloft with +UPT.  MOB endorsed presence of anxiety during her pregnancy in anticipation of the upcoming role transition.     Cognitive State:  Able to Concentrate , Alert , Goal Oriented , Linear Thinking    Mood/Affect:  Tearful (when discussing anxieties), Happy    CSW Assessment:  CSW received request for consult due to MOB presenting with a history of anxiety.  MOB's voice was soft and quiet, she reported feeling exhausted and tired, but was agreeable to the visit. Affect congruent with her stated mood. The FOB was also in the room, and MOB provided consent for him to remain in the room.  FOB was observed to be caring for and attending to the infant during the entire assessment.   Per MOB, she changed her birth plan on the way to the hospital due to the intensity of contractions.  She denied any negative feelings secondary to the changes since she was able to rest and enjoy the childbirth experience more.  MOB stated that giving birth was exhausting, and shared that she continues to feel exhausted since she has limited opportunity to sleep.  She discussed plans for her aunt to arrive at the hospital shortly in order for her to sleep.  MOB stated that she is currently experiencing difficulties with breastfeeding. She confirmed presence of negative automatic thoughts about herself during the middle of the night due to breastfeeding difficulties.  MOB stated that she felt responsible, but now that emotional intensity has been reduced, she recognizes that these thoughts are irrational and incomplete.  She stated that she knows that she cannot control her milk supply, that she is not purposefully making feedings more difficult, and that she cannot control the infant's feeding behaviors.  MOB was receptive to exploring evidence that supports that she is a good mother, and exploring positive self-talk that she can utilize to remind herself that she is going to be okay.    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  MOB processed with CSW her feelings related to the differences of caring for other people's children versus her own, but recognizes that perfection is an unrealistic goal for parenting.  MOB reported that she has a strong  support system, and family members that she can call at all hours of the night.   MOB confirmed history of anxiety, but stated that it was closely linked to a previous living environment.  MOB stated that pre-pregnancy, she was angry, emotional, highly tearful, and on edge. She reported that she would worry frequently, and her anxieties had a negative impact on her sleep. For these reasons, MOB stated that she sought out medication management at  [facility] and was prescribed Zoloft. She shared that the efficacy of the medication is not known since she was only taking the medication for a brief period of time before she learned that she was pregnant.  MOB reported overall improvement in anxiety during the pregnancy since she and the FOB secured their own housing.  She stated that she did note increase in anxiety and tearfulness just prior to the infant's birth. She stated that she asked herself if she was "ready", "prepared" and "capable" of parenting and being a mother. She endorsed feeling afraid, scared, and reported that she was crying for a large portion of the day.  MOB was receptive to identifying her cognitive distortions and exploring strategies to re-structure her thoughts.  MOB recognized that she is a good mother, that she is prepared, and that the role of emotions and hormones can sometimes cause her to doubt herself.  CSW provided education to [facility] on how to utilize these cognitive re-structing techniques postpartum.  CSW provided education on perinatal mood disorders, and informed MOB of her increased risk due to prior history of anxiety and subsequent symptoms during the pregnancy.  MOB verbalized understanding, and was receptive to exploring signs and symptoms that would warrant medical intervention.  MOB agreed to follow up with her OB provider or her previous psychiatrist in order to evaluate need for medication.  CSW continued to review with MOB the importance of sleep, a healthy diet, exercise, and incorporating self-care into her daily routine. MOB shared that she is appreciative of the help and support, and denied any barriers to accepting.   Per MOB, she is slowly gaining confidence in preparation for discharge. She stated that she will feel more prepared once feedings improve.  She expressed appreciation for the help and support from [facility], and stated that she is looking forward to learning how to set up her pump. MOB  reported that she is committed to providing the infant breast milk due to health benefits for the infant, but is receptive to expressing and bottle feeding the expressed milk if necessary.  MOB denied questions, concerns, or needs at this time. She expressed appreciation for the visit and support, and agreed to contact CSW if additional needs arise.  CSW Plan/Description:   1. Patient/Family Education -- perinatal mood and anxiety disorders 2.No Further Intervention Required/No Barriers to Discharge    Kelby Fam 01/09/2016, 9:54 AM

## 2016-01-09 NOTE — Progress Notes (Signed)
PPD#2 Pt states that she has not had a BM yet. Lochia mild. Pain control adequate VSSAF IMP/ Stable  Plan/ Will discharge

## 2016-01-09 NOTE — Lactation Note (Signed)
This note was copied from the chart of Lauren Hunter. Lactation Consultation Note  Patient Name: Lauren Hunter ZOXWR'U Date: 01/09/2016 Reason for consult: Follow-up assessment  With this first time mom and term baby, now 85 hours old. Mom had the baby latched in cradle hold, shallow, when I walked in the room. Mom agreed to me helping her relatch baby in cross cradle hold, I supported mom and baby with pillows, and baby latched easily and deeply. Mom reported painful latch initially, ut after a minute, no longer had discomfort. Mom encouraged to feed baby, eat breakfast, and then sleep. Dad was awake, and if mom wanted to hold baby skin to skin while napping, dad was available to keep an eye on mom and baby. Mom knows to call for questions/concerns. She has a personal DEEP she would like to be instructed in - I told her this could be done later. Mom also was shown how to hand express, and was happy to see colostrum.    Maternal Data    Feeding Feeding Type: Breast Fed  LATCH Score/Interventions Latch: Grasps breast easily, tongue down, lips flanged, rhythmical sucking. Intervention(s): Adjust position;Assist with latch;Breast compression  Audible Swallowing: A few with stimulation Intervention(s): Hand expression;Skin to skin  Type of Nipple: Everted at rest and after stimulation  Comfort (Breast/Nipple): Soft / non-tender     Hold (Positioning): Assistance needed to correctly position infant at breast and maintain latch. Intervention(s): Breastfeeding basics reviewed;Support Pillows;Position options;Skin to skin  LATCH Score: 8  Lactation Tools Discussed/Used     Consult Status Consult Status: Follow-up Date: 01/10/16 Follow-up type: In-patient    Alfred Levins 01/09/2016, 8:56 AM

## 2016-01-09 NOTE — Discharge Summary (Signed)
Obstetric Discharge Summary Reason for Admission: induction of labor Prenatal Procedures: NST and ultrasound Intrapartum Procedures: spontaneous vaginal delivery and episiotomy  ML Postpartum Procedures: none Complications-Operative and Postpartum: none HEMOGLOBIN  Date Value Ref Range Status  01/09/2016 9.5* 12.0 - 15.0 g/dL Final   HCT  Date Value Ref Range Status  01/09/2016 27.6* 36.0 - 46.0 % Final    Physical Exam:  General: alert Lochia: appropriate Uterine Fundus: firm   Discharge Diagnoses: Term Pregnancy-delivered and GDM  Discharge Information: Date: 01/09/2016 Activity: pelvic rest Diet: routine Medications: PNV, Ibuprofen, Colace and Percocet Condition: stable Instructions: refer to practice specific booklet Discharge to: home Follow-up Information    Follow up with CALLAHAN, SIDNEY, DO. Schedule an appointment as soon as possible for a visit in 1 month.   Specialty:  Obstetrics and Gynecology   Contact information:   [address] [place] [address] [phone]       Newborn Data: Live born female  Birth Weight: 6 lb 11.6 oz (3050 g) APGAR: 8,   Home with mother.  Demarqus Jocson E 01/09/2016, 8:38 AM

## 2016-01-09 NOTE — Lactation Note (Signed)
This note was copied from the chart of Lauren Hunter. Lactation Consultation Note  Patient Name: Lauren Hunter [id number] Date: 01/09/2016 Reason for consult: Follow-up assessment Baby at 36 hr of life and mom is requesting more time with lactation. She asked for help positioning the baby, demonstrated football hold. Answered questions about maternal diet. She is not pumping now but requested help setting up her personal DEBP. Discussed baby behavior, feeding frequency, baby belly size, voids, wt loss, breast changes, and nipple care. Reviewed lactation handouts. She is aware of OP services and support group.    Maternal Data    Feeding Feeding Type: Breast Fed Length of feed: 10 min  LATCH Score/Interventions Latch: Repeated attempts needed to sustain latch, nipple held in mouth throughout feeding, stimulation needed to elicit sucking reflex. Intervention(s): Assist with latch;Breast compression;Adjust position  Audible Swallowing: Spontaneous and intermittent Intervention(s): Hand expression;Skin to skin Intervention(s): Alternate breast massage;Hand expression;Skin to skin  Type of Nipple: Everted at rest and after stimulation  Comfort (Breast/Nipple): Filling, red/small blisters or bruises, mild/mod discomfort  Problem noted: Mild/Moderate discomfort Interventions (Mild/moderate discomfort): Hand expression;Hand massage;Comfort gels  Hold (Positioning): Assistance needed to correctly position infant at breast and maintain latch. Intervention(s): Position options;Support Pillows  LATCH Score: 7  Lactation Tools Discussed/Used Pump Review: Setup, frequency, and cleaning;Milk Storage Initiated by:: ES Date initiated:: 01/09/16   Consult Status Consult Status: Follow-up Date: 01/10/16 Follow-up type: In-patient    Rulon Eisenmenger 01/09/2016, 4:54 PM

## 2016-01-10 MED ORDER — IBUPROFEN 600 MG PO TABS
600.0000 mg | ORAL_TABLET | Freq: Four times a day (QID) | ORAL | Status: DC
Start: 2016-01-10 — End: 2017-02-15

## 2016-01-10 NOTE — Lactation Note (Signed)
This note was copied from the chart of Lauren Hunter. Lactation Consultation Note   Follow up consult with this first time mom and baby. Mom was holding sleeping baby at this time. She reports breast feeding going well, and denies andy discomfort and questions at this time. Breast care/engorgement reviewed with mom. Mom kows to call lactation for questions/concerns  Patient Name: Lauren Tiffnay Bossi [id number] Date: 01/10/2016    Maternal Data    Feeding Feeding Type: Breast Fed Length of feed: 10 min  LATCH Score/Interventions                      Lactation Tools Discussed/Used     Consult Status Consult Status: Complete Follow-up type: Call as needed    Alfred Levins 01/10/2016, 9:11 AM

## 2016-01-10 NOTE — Progress Notes (Signed)
Patient is doing well.  She is ambulating, voiding, tolerating PO.  Pain control is good.  Lochia is appropriate  Filed Vitals:   01/08/16 1950 01/09/16 0800 01/09/16 1741 01/10/16 0545  BP:  132/76 133/76 148/83  Pulse: 74 74 94 92  Temp: 98.3 F (36.8 C) 98.2 F (36.8 C) 97.9 F (36.6 C) 98 F (36.7 C)  TempSrc: Oral Oral Oral   Resp: Height:      Weight:      SpO2: 99% 99%      NAD Fundus firm Ext:   Lab Results  Component Value Date   WBC 9.8 01/09/2016   HGB 9.5* 01/09/2016   HCT 27.6* 01/09/2016   MCV 88.2 01/09/2016   PLT 147* 01/09/2016    --/--/B POS (01/31 0116)/RImmune  A/P 19 y.o. G1P1001 PPD#2 s/p TSVD. Routine care.   GDMA2--plan 2hr gtt PP Expect d/c today.    [facility] GEFFEL [employer]

## 2016-01-13 ENCOUNTER — Inpatient Hospital Stay (HOSPITAL_COMMUNITY)
Admission: AD | Admit: 2016-01-13 | Payer: Managed Care, Other (non HMO) | Source: Ambulatory Visit | Admitting: Obstetrics and Gynecology

## 2016-01-29 ENCOUNTER — Encounter (HOSPITAL_COMMUNITY): Payer: Self-pay

## 2016-06-25 ENCOUNTER — Other Ambulatory Visit: Payer: Self-pay | Admitting: Obstetrics and Gynecology

## 2016-06-26 LAB — CYTOLOGY - PAP

## 2016-07-02 LAB — OB RESULTS CONSOLE ABO/RH: RH TYPE: POSITIVE

## 2016-07-02 LAB — OB RESULTS CONSOLE GC/CHLAMYDIA
CHLAMYDIA, DNA PROBE: NEGATIVE
Gonorrhea: NEGATIVE

## 2016-07-02 LAB — OB RESULTS CONSOLE ANTIBODY SCREEN: Antibody Screen: NEGATIVE

## 2016-07-02 LAB — OB RESULTS CONSOLE HEPATITIS B SURFACE ANTIGEN: Hepatitis B Surface Ag: NEGATIVE

## 2016-07-02 LAB — OB RESULTS CONSOLE RPR: RPR: NONREACTIVE

## 2016-07-02 LAB — OB RESULTS CONSOLE RUBELLA ANTIBODY, IGM: Rubella: IMMUNE

## 2016-07-02 LAB — OB RESULTS CONSOLE HIV ANTIBODY (ROUTINE TESTING): HIV: NONREACTIVE

## 2016-08-14 IMAGING — US US OB TRANSVAGINAL
1 series · 13 of 28 positions shown · non-contrast
Comparison: CT of the abdomen and pelvis performed 07/11/2008

CLINICAL DATA: Acute onset of bilateral pelvic pain and cramping.
Initial encounter.

EXAM:
OBSTETRIC <14 WK US AND TRANSVAGINAL OB US
TECHNIQUE: Both transabdominal and transvaginal ultrasound examinations were
performed for complete evaluation of the gestation as well as the
maternal uterus, adnexal regions, and pelvic cul-de-sac.
Transvaginal technique was performed to assess early pregnancy.

[Series 1: us ob comp less 14 wk · 13 of 59 slices shown]
[im 3/59]
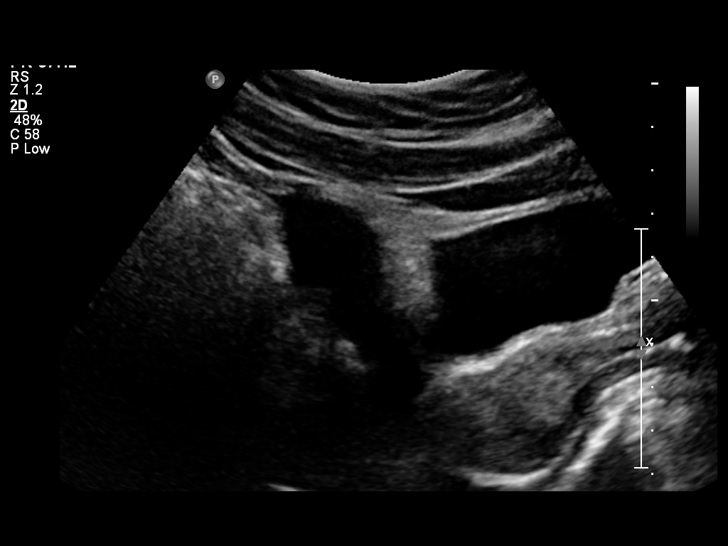
[im 7/59]
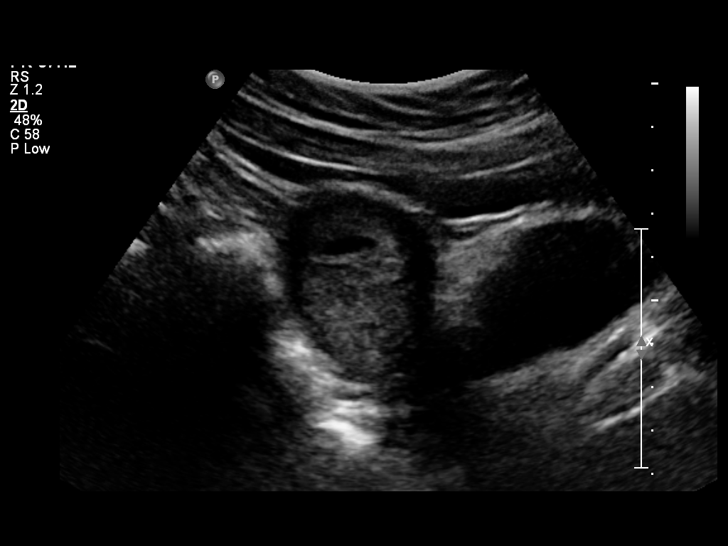
[im 11/59]
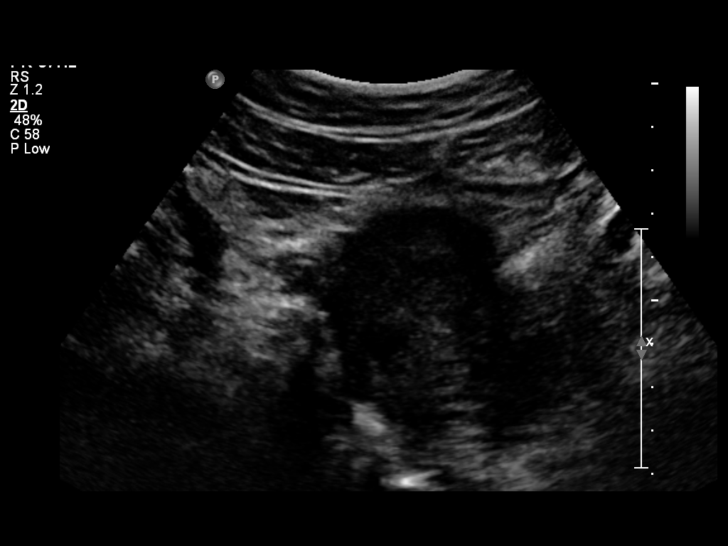
[im 16/59]
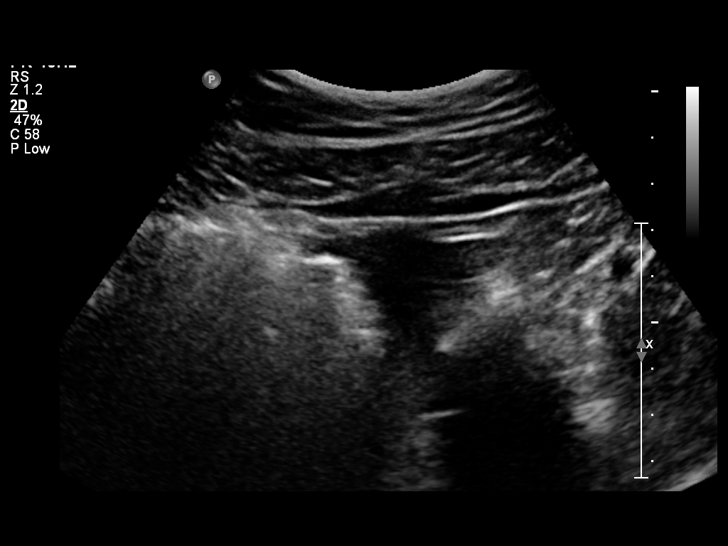
[im 20/59]
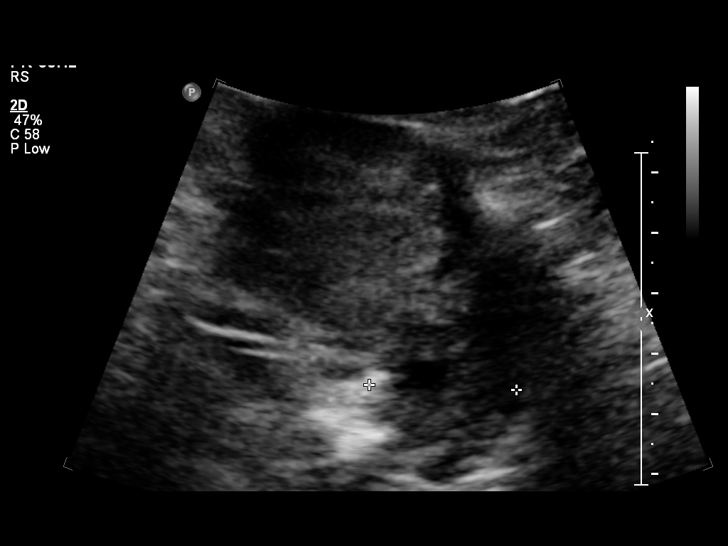
[im 24/59]
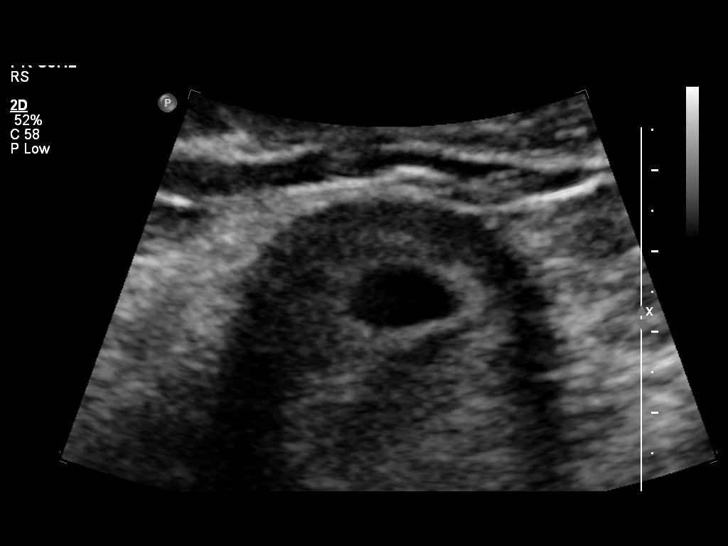
[im 31/59]
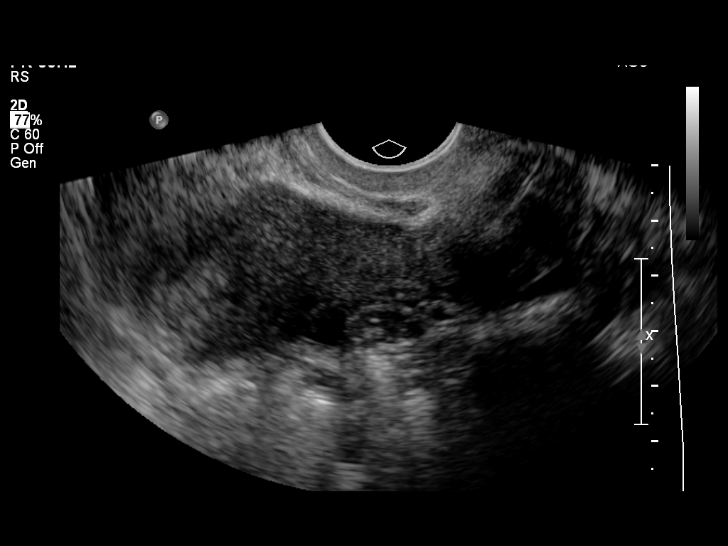
[im 35/59]
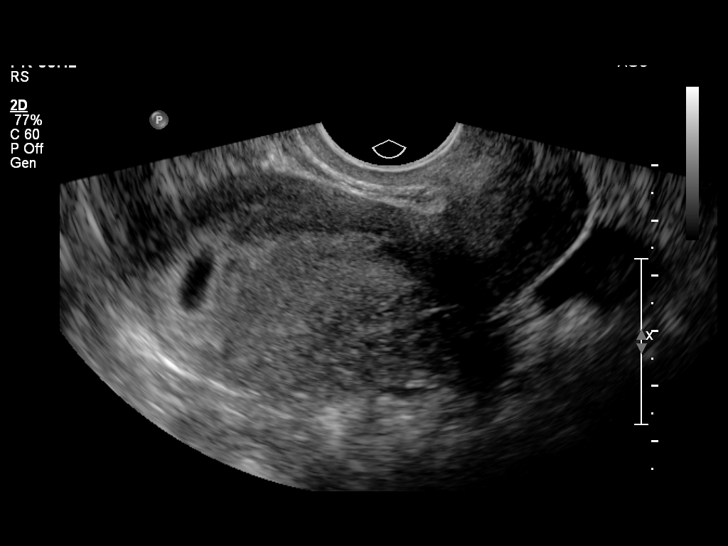
[im 39/59]
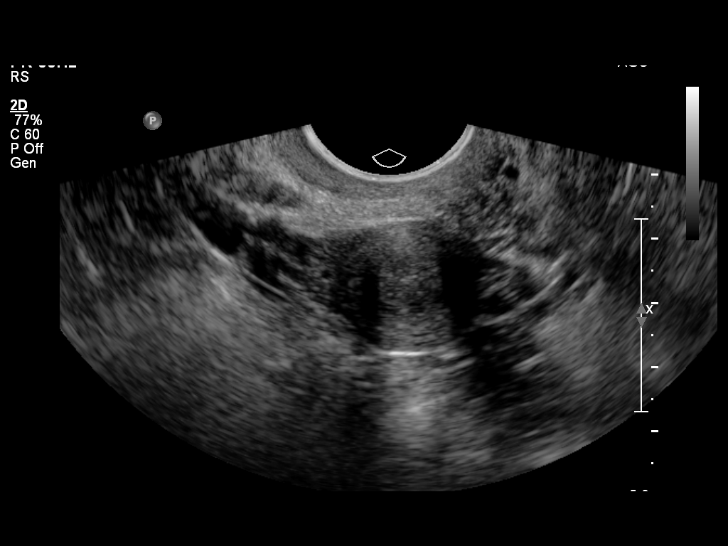
[im 43/59]
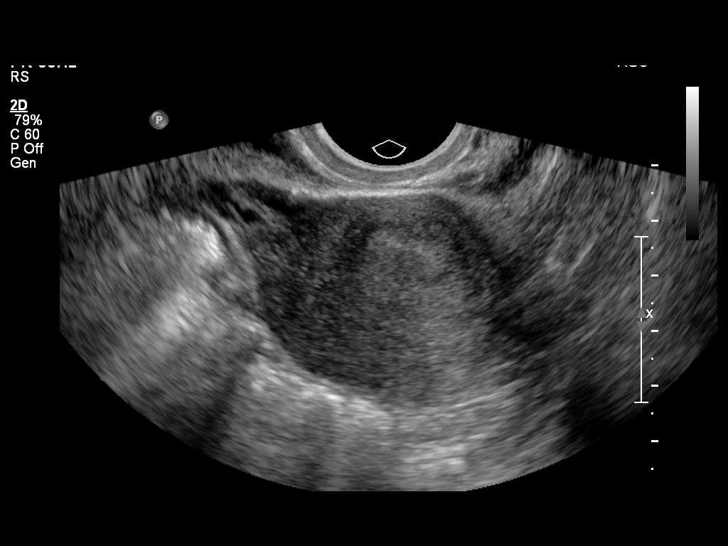
[im 48/59]
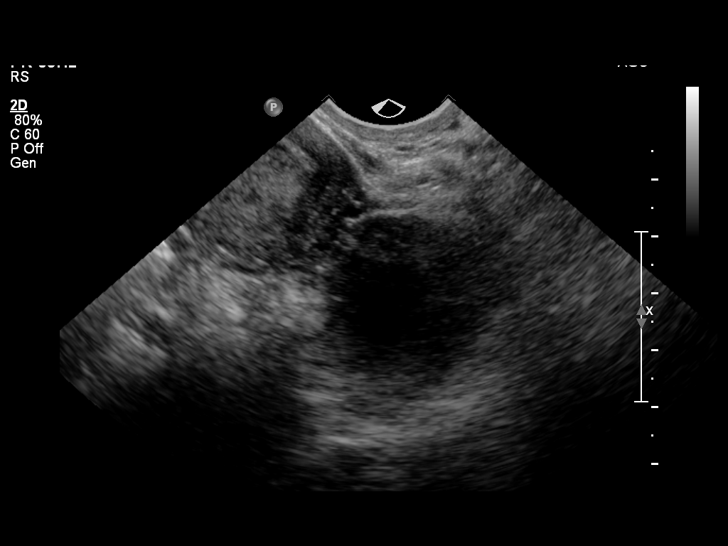
[im 52/59]
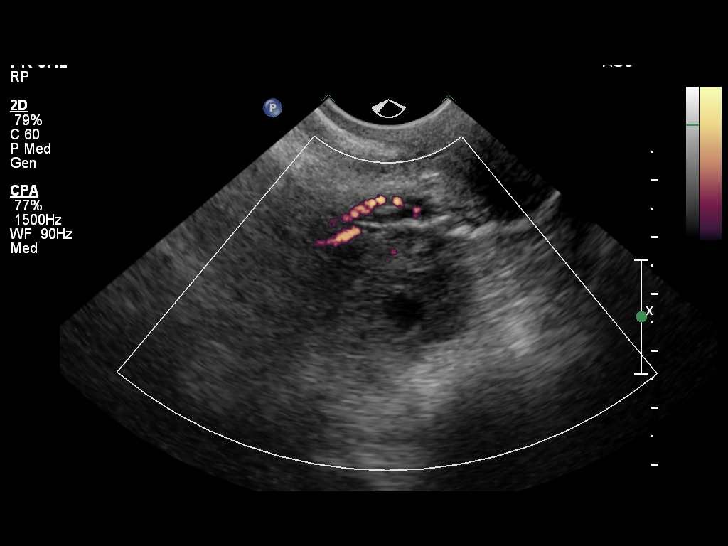
[im 56/59]
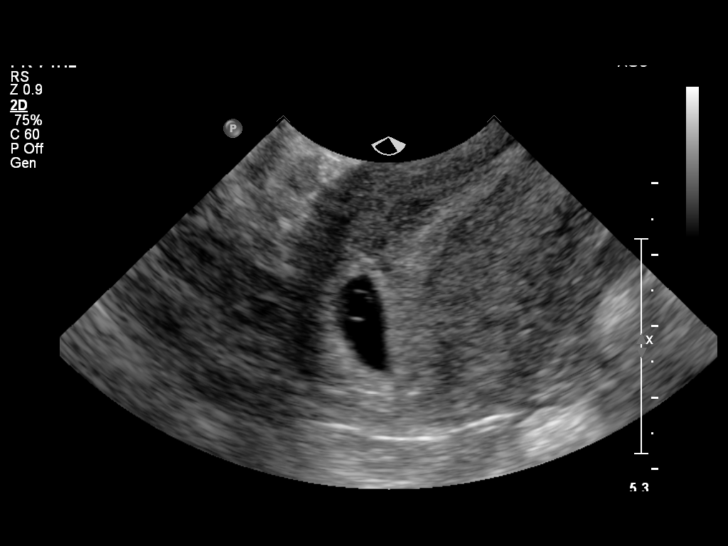

[13 of 28 positions shown; findings below may reference images not displayed]

FINDINGS: Intrauterine gestational sac: Visualized/normal in shape.

Yolk sac:  Yes

Embryo:  No

Cardiac Activity: N/A

MSD: 1.3 cm   6 w   1  d

Maternal uterus/adnexae: No subchorionic hemorrhage is noted. The
uterus is otherwise unremarkable.

The ovaries are within normal limits, though the right ovary is not
fully measured on this study. The left ovary measures 3.5 x 2.8 x
2.9 cm. No suspicious adnexal masses are seen. There is no evidence
for ovarian torsion.

Trace free fluid is seen within the pelvic cul-de-sac.
IMPRESSION: Single intrauterine gestational sac noted, with a mean sac diameter
of 1.3 cm, corresponding to a gestational age of 6 weeks 1 day. This
matches the gestational age of 6 weeks 2 days by LMP, reflecting an
estimated date of delivery January 13, 2016. A yolk sac is seen.
The embryo is not yet visualized, within normal limits.

## 2016-12-07 NOTE — L&D Delivery Note (Signed)
Patient was C/C/+3 and pushed for 7 minutes with epidural.   NSVD  female infant, Apgars 9,9, weight P.   The patient had Hunter midline first degree perineal and first degree R labial lacerations repaired with 2-0 vicryl R and 3-0 Vicryl R. Fundus was firm. EBL was expected amount. Placenta was delivered intact. Vagina was clear.  Baby was vigorous and doing skin to skin with mother.  Lauren Hunter

## 2017-01-19 ENCOUNTER — Other Ambulatory Visit: Payer: Self-pay | Admitting: Obstetrics and Gynecology

## 2017-01-19 LAB — OB RESULTS CONSOLE GBS: GBS: NEGATIVE

## 2017-02-08 ENCOUNTER — Other Ambulatory Visit: Payer: Self-pay | Admitting: Obstetrics and Gynecology

## 2017-02-08 ENCOUNTER — Telehealth (HOSPITAL_COMMUNITY): Payer: Self-pay | Admitting: *Deleted

## 2017-02-08 ENCOUNTER — Encounter (HOSPITAL_COMMUNITY): Payer: Self-pay | Admitting: *Deleted

## 2017-02-08 NOTE — Telephone Encounter (Signed)
Preadmission screen  

## 2017-02-15 ENCOUNTER — Inpatient Hospital Stay (EMERGENCY_DEPARTMENT_HOSPITAL)
Admission: AD | Admit: 2017-02-15 | Discharge: 2017-02-15 | Disposition: A | Discharge status: Home / Self Care | Payer: Managed Care, Other (non HMO) | Source: Ambulatory Visit | Attending: Obstetrics and Gynecology | Admitting: Obstetrics and Gynecology

## 2017-02-15 ENCOUNTER — Encounter (HOSPITAL_COMMUNITY): Payer: Self-pay | Admitting: *Deleted

## 2017-02-15 DIAGNOSIS — E162 Hypoglycemia, unspecified: Secondary | ICD-10-CM

## 2017-02-15 DIAGNOSIS — O24419 Gestational diabetes mellitus in pregnancy, unspecified control: Secondary | ICD-10-CM | POA: Insufficient documentation

## 2017-02-15 DIAGNOSIS — Z9889 Other specified postprocedural states: Secondary | ICD-10-CM

## 2017-02-15 DIAGNOSIS — Z833 Family history of diabetes mellitus: Secondary | ICD-10-CM | POA: Insufficient documentation

## 2017-02-15 DIAGNOSIS — F419 Anxiety disorder, unspecified: Secondary | ICD-10-CM

## 2017-02-15 DIAGNOSIS — O288 Other abnormal findings on antenatal screening of mother: Secondary | ICD-10-CM | POA: Diagnosis not present

## 2017-02-15 DIAGNOSIS — O99343 Other mental disorders complicating pregnancy, third trimester: Secondary | ICD-10-CM

## 2017-02-15 DIAGNOSIS — Z8249 Family history of ischemic heart disease and other diseases of the circulatory system: Secondary | ICD-10-CM | POA: Insufficient documentation

## 2017-02-15 DIAGNOSIS — Z79899 Other long term (current) drug therapy: Secondary | ICD-10-CM | POA: Insufficient documentation

## 2017-02-15 DIAGNOSIS — Z888 Allergy status to other drugs, medicaments and biological substances status: Secondary | ICD-10-CM | POA: Insufficient documentation

## 2017-02-15 DIAGNOSIS — O24415 Gestational diabetes mellitus in pregnancy, controlled by oral hypoglycemic drugs: Secondary | ICD-10-CM

## 2017-02-15 DIAGNOSIS — M419 Scoliosis, unspecified: Secondary | ICD-10-CM

## 2017-02-15 DIAGNOSIS — Z3A38 38 weeks gestation of pregnancy: Secondary | ICD-10-CM

## 2017-02-15 LAB — URINALYSIS, ROUTINE W REFLEX MICROSCOPIC
Bilirubin Urine: NEGATIVE
Glucose, UA: NEGATIVE mg/dL
HGB URINE DIPSTICK: NEGATIVE
Ketones, ur: NEGATIVE mg/dL
NITRITE: NEGATIVE
PH: 6 (ref 5.0–8.0)
Protein, ur: NEGATIVE mg/dL
SPECIFIC GRAVITY, URINE: 1.006 (ref 1.005–1.030)

## 2017-02-15 LAB — GLUCOSE, CAPILLARY: GLUCOSE-CAPILLARY: 74 mg/dL (ref 65–99)

## 2017-02-15 NOTE — MAU Provider Note (Signed)
History     CSN: [id number]  Arrival date and time: 02/15/17 1136   First Provider Initiated Contact with Patient 02/15/17 1222      No chief complaint on file.  Patient is a 21 year old G2 P1 at 38 weeks and 6 days who presents today at the instruction of Dr. Claiborne Billings from the office. She was getting her regularly scheduled NST for gestational diabetes and has plans to be induced tomorrow. Per report from the office for NST in the office showed a low baseline and possibly variable deceleration. She reports regular fetal movement she's not feeling any contractions and she has no vaginal bleeding. She has no other concerns today.     OB History    Gravida Para Term Preterm AB Living   2 1 1     1    SAB TAB Ectopic Multiple Live Births         0 1      Past Medical History:  Diagnosis Date  . Allergy   . Anxiety   . Gestational diabetes    inslulin  . Gestational diabetes mellitus (GDM), antepartum   . Scoliosis     Past Surgical History:  Procedure Laterality Date  . BACK SURGERY    . INCISION AND DRAINAGE     MRSA related R thumb 2009 and L toe 2003  . Scoliosis-Harrington Rods      Family History  Problem Relation Age of Onset  . Crohn's disease Mother   . Heart failure Mother   . Diabetes Paternal Grandmother   . Diabetes Paternal Grandfather     Social History  Substance Use Topics  . Smoking status: Never Smoker  . Smokeless tobacco: Never Used  . Alcohol use No    Allergies:  Allergies  Allergen Reactions  . Codeine Rash    Prescriptions Prior to Admission  Medication Sig Dispense Refill Last Dose  . ibuprofen (ADVIL,MOTRIN) 600 MG tablet Take 1 tablet (600 mg total) by mouth every 6 (six) hours. 40 tablet 0   . oxyCODONE 10 MG TABS Take 1 tablet (10 mg total) by mouth every 4 (four) hours as needed (pain scale > 7). 30 tablet 0   . Prenatal Vit-Min-FA-Fish Oil ([facility] PRENATAL GUMMY PO) Take 2 tablets by mouth daily.   01/06/2016 at Unknown time   . senna-docusate (SENOKOT-S) 8.6-50 MG tablet Take 2 tablets by mouth daily. 30 tablet 0   . simethicone (MYLICON) 80 MG chewable tablet Chew 1 tablet (80 mg total) by mouth as needed for flatulence. 30 tablet 0     Review of Systems  Constitutional: Negative for chills and fever.  HENT: Negative for congestion and rhinorrhea.   Respiratory: Negative for cough and shortness of breath.   Cardiovascular: Negative for chest pain and palpitations.  Gastrointestinal: Negative for abdominal distention, abdominal pain, constipation, diarrhea, nausea and vomiting.  Genitourinary: Negative for difficulty urinating, dysuria, flank pain and frequency.  Neurological: Negative for dizziness, seizures and weakness.   Physical Exam   Blood pressure 129/78, pulse 90, temperature 97.9 F (36.6 C), temperature source Oral, resp. rate 20, SpO2 99 %, unknown if currently breastfeeding.  Physical Exam  Constitutional: She is oriented to person, place, and time. She appears well-developed and well-nourished.  HENT:  Head: Normocephalic and atraumatic.  Cardiovascular: Normal rate and intact distal pulses.  Exam reveals no gallop and no friction rub.   No murmur heard. Respiratory: Effort normal. No respiratory distress. She has no wheezes.  GI: Soft.  She exhibits no distension. There is no tenderness. There is no rebound and no guarding.  Musculoskeletal: Normal range of motion. She exhibits no edema.  Neurological: She is alert and oriented to person, place, and time. No cranial nerve deficit.  Skin: Skin is warm and dry.  Psychiatric: She has a normal mood and affect. Her behavior is normal.    MAU Course  Procedures  MDM In MA U patient underwent 1 hour of monitoring. During that time she had no decelerations. She had regular fetal movement and a baseline fetal heart rate in the 110's to 120's. Accelerations were present and the strip was considered reactive. Moderate variability noted.  While  in the MA U patient did miss lunch and cons currently had a low blood sugar. This checked and found to be 54. She was given apple juice and crackers. She was feeling much better after this recheck her blood sugar showed it was within normal limits and patient was discharged home  Assessment and Plan  #1. Nonreactive NST in the office: Reactive here. Discussed with Dr. Claiborne Billingsallahan who agreed with discharge home and plan for induction tomorrow. Gave patient a obstruction for fetal kick counts until arrival tomorrow. #2: Transient hypoglycemia: Resolved with by mouth intake.  Ernestina Pennaicholas Schenk 02/15/2017, 12:41 PM

## 2017-02-15 NOTE — MAU Note (Addendum)
Pt is here for extended monitoring after being seen in office.  Pt FHR in office was a low baseline.  Reports good fetal movement.  Denies any vaginal bleeding or ROM.  Pt is a GDM.  Scheduled for induction tomorrow.

## 2017-02-16 ENCOUNTER — Inpatient Hospital Stay (HOSPITAL_COMMUNITY)
Admission: RE | Admit: 2017-02-16 | Discharge: 2017-02-18 | DRG: 775 | Disposition: A | Discharge status: Home / Self Care | Payer: Managed Care, Other (non HMO) | Source: Ambulatory Visit | Attending: Obstetrics and Gynecology | Admitting: Obstetrics and Gynecology

## 2017-02-16 ENCOUNTER — Inpatient Hospital Stay (HOSPITAL_COMMUNITY): Payer: Managed Care, Other (non HMO) | Admitting: Anesthesiology

## 2017-02-16 ENCOUNTER — Encounter (HOSPITAL_COMMUNITY): Payer: Self-pay

## 2017-02-16 VITALS — BP 131/70 | HR 86 | Temp 97.7°F | Resp 18 | Ht 64.0 in | Wt 187.0 lb

## 2017-02-16 DIAGNOSIS — Z3A39 39 weeks gestation of pregnancy: Secondary | ICD-10-CM | POA: Diagnosis not present

## 2017-02-16 DIAGNOSIS — O24424 Gestational diabetes mellitus in childbirth, insulin controlled: Principal | ICD-10-CM | POA: Diagnosis present

## 2017-02-16 DIAGNOSIS — O288 Other abnormal findings on antenatal screening of mother: Secondary | ICD-10-CM | POA: Diagnosis not present

## 2017-02-16 DIAGNOSIS — O24419 Gestational diabetes mellitus in pregnancy, unspecified control: Secondary | ICD-10-CM | POA: Diagnosis present

## 2017-02-16 DIAGNOSIS — O24410 Gestational diabetes mellitus in pregnancy, diet controlled: Secondary | ICD-10-CM

## 2017-02-16 DIAGNOSIS — M419 Scoliosis, unspecified: Secondary | ICD-10-CM | POA: Diagnosis present

## 2017-02-16 LAB — CBC
HEMATOCRIT: 32.8 % — AB (ref 36.0–46.0)
HEMOGLOBIN: 10.7 g/dL — AB (ref 12.0–15.0)
MCH: 25.2 pg — ABNORMAL LOW (ref 26.0–34.0)
MCHC: 32.6 g/dL (ref 30.0–36.0)
MCV: 77.2 fL — AB (ref 78.0–100.0)
Platelets: 203 10*3/uL (ref 150–400)
RBC: 4.25 MIL/uL (ref 3.87–5.11)
RDW: 14.7 % (ref 11.5–15.5)
WBC: 7.3 10*3/uL (ref 4.0–10.5)

## 2017-02-16 LAB — GLUCOSE, CAPILLARY
GLUCOSE-CAPILLARY: 89 mg/dL (ref 65–99)
GLUCOSE-CAPILLARY: 86 mg/dL (ref 65–99)
GLUCOSE-CAPILLARY: 79 mg/dL (ref 65–99)
Glucose-Capillary: 107 mg/dL — ABNORMAL HIGH (ref 65–99)
Glucose-Capillary: 127 mg/dL — ABNORMAL HIGH (ref 65–99)

## 2017-02-16 LAB — TYPE AND SCREEN
ABO/RH(D): B POS
Antibody Screen: NEGATIVE

## 2017-02-16 MED ORDER — MEASLES, MUMPS & RUBELLA VAC ~~LOC~~ INJ: 0.5000 mL | INJECTION | Freq: Once | SUBCUTANEOUS | Status: DC

## 2017-02-16 MED ORDER — IBUPROFEN 800 MG PO TABS
800.0000 mg | ORAL_TABLET | Freq: Three times a day (TID) | ORAL | Status: DC
  Administered 2017-02-16 – 2017-02-18 (×6): 800 mg via ORAL
  Filled 2017-02-16 (×7): qty 1

## 2017-02-16 MED ORDER — COCONUT OIL OIL
1.0000 "application " | TOPICAL_OIL | Status: DC | PRN
  Administered 2017-02-17: 1 via TOPICAL
  Filled 2017-02-16: qty 120

## 2017-02-16 MED ORDER — PHENYLEPHRINE 40 MCG/ML (10ML) SYRINGE FOR IV PUSH (FOR BLOOD PRESSURE SUPPORT)
80.0000 ug | PREFILLED_SYRINGE | INTRAVENOUS | Status: DC | PRN
Start: 2017-02-16 — End: 2017-02-16
  Filled 2017-02-16: qty 10
  Filled 2017-02-16: qty 5

## 2017-02-16 MED ORDER — TETANUS-DIPHTH-ACELL PERTUSSIS 5-2.5-18.5 LF-MCG/0.5 IM SUSP: 0.5000 mL | Freq: Once | INTRAMUSCULAR | Status: DC

## 2017-02-16 MED ORDER — FAMOTIDINE 20 MG PO TABS
20.0000 mg | ORAL_TABLET | Freq: Two times a day (BID) | ORAL | Status: DC
  Administered 2017-02-16 – 2017-02-17 (×3): 20 mg via ORAL
  Filled 2017-02-16 (×3): qty 1

## 2017-02-16 MED ORDER — SOD CITRATE-CITRIC ACID 500-334 MG/5ML PO SOLN: 30.0000 mL | ORAL | Status: DC | PRN

## 2017-02-16 MED ORDER — DIPHENHYDRAMINE HCL 25 MG PO CAPS: 25.0000 mg | ORAL_CAPSULE | Freq: Four times a day (QID) | ORAL | Status: DC | PRN

## 2017-02-16 MED ORDER — LACTATED RINGERS IV SOLN: 500.0000 mL | INTRAVENOUS | Status: DC | PRN

## 2017-02-16 MED ORDER — SENNOSIDES-DOCUSATE SODIUM 8.6-50 MG PO TABS
2.0000 | ORAL_TABLET | ORAL | Status: DC
  Administered 2017-02-16 – 2017-02-17 (×2): 2 via ORAL
  Filled 2017-02-16 (×2): qty 2

## 2017-02-16 MED ORDER — WITCH HAZEL-GLYCERIN EX PADS
1.0000 | MEDICATED_PAD | CUTANEOUS | Status: DC | PRN
Start: 2017-02-16 — End: 2017-02-18

## 2017-02-16 MED ORDER — EPHEDRINE 5 MG/ML INJ
10.0000 mg | INTRAVENOUS | Status: DC | PRN
Start: 2017-02-16 — End: 2017-02-16
  Filled 2017-02-16: qty 4

## 2017-02-16 MED ORDER — TERBUTALINE SULFATE 1 MG/ML IJ SOLN
0.2500 mg | Freq: Once | INTRAMUSCULAR | Status: DC | PRN
  Filled 2017-02-16: qty 1

## 2017-02-16 MED ORDER — ONDANSETRON HCL 4 MG/2ML IJ SOLN
4.0000 mg | Freq: Four times a day (QID) | INTRAMUSCULAR | Status: DC | PRN
  Administered 2017-02-16: 4 mg via INTRAVENOUS
  Filled 2017-02-16: qty 2

## 2017-02-16 MED ORDER — OXYCODONE-ACETAMINOPHEN 5-325 MG PO TABS
1.0000 | ORAL_TABLET | ORAL | Status: DC | PRN
  Administered 2017-02-17 (×3): 1 via ORAL
  Filled 2017-02-16 (×2): qty 1

## 2017-02-16 MED ORDER — LIDOCAINE HCL (PF) 1 % IJ SOLN
INTRAMUSCULAR | Status: DC | PRN
  Administered 2017-02-16: 3 mL via EPIDURAL
  Administered 2017-02-16: 4 mL
  Administered 2017-02-16: 3 mL via EPIDURAL

## 2017-02-16 MED ORDER — DIPHENHYDRAMINE HCL 50 MG/ML IJ SOLN
12.5000 mg | INTRAMUSCULAR | Status: DC | PRN
  Administered 2017-02-16 (×2): 12.5 mg via INTRAVENOUS
  Filled 2017-02-16: qty 1

## 2017-02-16 MED ORDER — DIBUCAINE 1 % RE OINT: 1.0000 "application " | TOPICAL_OINTMENT | RECTAL | Status: DC | PRN

## 2017-02-16 MED ORDER — FERROUS SULFATE 325 (65 FE) MG PO TABS
325.0000 mg | ORAL_TABLET | Freq: Two times a day (BID) | ORAL | Status: DC
  Administered 2017-02-17 – 2017-02-18 (×3): 325 mg via ORAL
  Filled 2017-02-16 (×3): qty 1

## 2017-02-16 MED ORDER — PRENATAL MULTIVITAMIN CH
1.0000 | ORAL_TABLET | Freq: Every day | ORAL | Status: DC
  Administered 2017-02-17: 1 via ORAL
  Filled 2017-02-16: qty 1

## 2017-02-16 MED ORDER — ZOLPIDEM TARTRATE 5 MG PO TABS: 5.0000 mg | ORAL_TABLET | Freq: Every evening | ORAL | Status: DC | PRN

## 2017-02-16 MED ORDER — FENTANYL 2.5 MCG/ML BUPIVACAINE 1/10 % EPIDURAL INFUSION (WH - ANES)
14.0000 mL/h | INTRAMUSCULAR | Status: DC | PRN
  Administered 2017-02-16: 14 mL/h via EPIDURAL
  Filled 2017-02-16: qty 100

## 2017-02-16 MED ORDER — PHENYLEPHRINE 40 MCG/ML (10ML) SYRINGE FOR IV PUSH (FOR BLOOD PRESSURE SUPPORT)
80.0000 ug | PREFILLED_SYRINGE | INTRAVENOUS | Status: DC | PRN
  Filled 2017-02-16: qty 10
  Filled 2017-02-16: qty 5

## 2017-02-16 MED ORDER — SODIUM CHLORIDE 0.9 % IV SOLN: 250.0000 mL | INTRAVENOUS | Status: DC | PRN

## 2017-02-16 MED ORDER — BENZOCAINE-MENTHOL 20-0.5 % EX AERO
1.0000 "application " | INHALATION_SPRAY | CUTANEOUS | Status: DC | PRN
  Filled 2017-02-16: qty 56

## 2017-02-16 MED ORDER — ACETAMINOPHEN 325 MG PO TABS
650.0000 mg | ORAL_TABLET | ORAL | Status: DC | PRN
  Administered 2017-02-16: 650 mg via ORAL
  Filled 2017-02-16: qty 2

## 2017-02-16 MED ORDER — METHYLERGONOVINE MALEATE 0.2 MG PO TABS: 0.2000 mg | ORAL_TABLET | ORAL | Status: DC | PRN

## 2017-02-16 MED ORDER — MAGNESIUM HYDROXIDE 400 MG/5ML PO SUSP
30.0000 mL | ORAL | Status: DC | PRN
  Administered 2017-02-18: 30 mL via ORAL
  Filled 2017-02-16: qty 30

## 2017-02-16 MED ORDER — OXYCODONE-ACETAMINOPHEN 5-325 MG PO TABS
2.0000 | ORAL_TABLET | ORAL | Status: DC | PRN
  Administered 2017-02-17 – 2017-02-18 (×2): 2 via ORAL
  Filled 2017-02-16 (×3): qty 2

## 2017-02-16 MED ORDER — METHYLERGONOVINE MALEATE 0.2 MG/ML IJ SOLN: 0.2000 mg | INTRAMUSCULAR | Status: DC | PRN

## 2017-02-16 MED ORDER — EPHEDRINE 5 MG/ML INJ
10.0000 mg | INTRAVENOUS | Status: DC | PRN
  Filled 2017-02-16: qty 4

## 2017-02-16 MED ORDER — OXYTOCIN BOLUS FROM INFUSION
500.0000 mL | Freq: Once | INTRAVENOUS | Status: AC
  Administered 2017-02-16: 500 mL via INTRAVENOUS

## 2017-02-16 MED ORDER — ACETAMINOPHEN 325 MG PO TABS: 650.0000 mg | ORAL_TABLET | ORAL | Status: DC | PRN

## 2017-02-16 MED ORDER — LACTATED RINGERS IV SOLN
500.0000 mL | Freq: Once | INTRAVENOUS | Status: AC
Start: 2017-02-16 — End: 2017-02-16
  Administered 2017-02-16: 500 mL via INTRAVENOUS

## 2017-02-16 MED ORDER — ONDANSETRON HCL 4 MG PO TABS: 4.0000 mg | ORAL_TABLET | ORAL | Status: DC | PRN

## 2017-02-16 MED ORDER — OXYTOCIN 40 UNITS IN LACTATED RINGERS INFUSION - SIMPLE MED
1.0000 m[IU]/min | INTRAVENOUS | Status: DC
  Administered 2017-02-16: 2 m[IU]/min via INTRAVENOUS
  Filled 2017-02-16: qty 1000

## 2017-02-16 MED ORDER — SODIUM CHLORIDE 0.9% FLUSH: 3.0000 mL | INTRAVENOUS | Status: DC | PRN

## 2017-02-16 MED ORDER — SODIUM CHLORIDE 0.9% FLUSH: 3.0000 mL | Freq: Two times a day (BID) | INTRAVENOUS | Status: DC

## 2017-02-16 MED ORDER — LIDOCAINE HCL (PF) 1 % IJ SOLN
30.0000 mL | INTRAMUSCULAR | Status: DC | PRN
  Administered 2017-02-16: 30 mL via SUBCUTANEOUS
  Filled 2017-02-16: qty 30

## 2017-02-16 MED ORDER — SIMETHICONE 80 MG PO CHEW: 80.0000 mg | CHEWABLE_TABLET | ORAL | Status: DC | PRN

## 2017-02-16 MED ORDER — LACTATED RINGERS IV SOLN
INTRAVENOUS | Status: DC
  Administered 2017-02-16 (×2): via INTRAVENOUS

## 2017-02-16 MED ORDER — OXYTOCIN 40 UNITS IN LACTATED RINGERS INFUSION - SIMPLE MED
2.5000 [IU]/h | INTRAVENOUS | Status: DC
  Administered 2017-02-16: 2.5 [IU]/h via INTRAVENOUS

## 2017-02-16 MED ORDER — ONDANSETRON HCL 4 MG/2ML IJ SOLN: 4.0000 mg | INTRAMUSCULAR | Status: DC | PRN

## 2017-02-16 NOTE — H&P (Addendum)
21 y.o. 8028w0d  G2P1001 comes in for induction at term for A2GDM.  Otherwise has good fetal movement and no bleeding.  Blood sugars have been controlled with insulin.  Past Medical History:  Diagnosis Date  . Allergy   . Anxiety   . Gestational diabetes    inslulin  . Gestational diabetes mellitus (GDM), antepartum   . Scoliosis     Past Surgical History:  Procedure Laterality Date  . BACK SURGERY    . INCISION AND DRAINAGE     MRSA related R thumb 2009 and L toe 2003  . Scoliosis-Harrington Rods      OB History  Gravida Para Term Preterm AB Living  2 1 1     1   SAB TAB Ectopic Multiple Live Births        0 1    # Outcome Date GA Lbr Len/2nd Weight Sex Delivery Anes PTL Lv  2 Current           1 Term 01/08/16 7262w2d 18:13 6 lb 11.6 oz (3.05 kg) M Vag-Spont EPI  LIV      Social History   Social History  . Marital status: Married    Spouse name: N/A  . Number of children: N/A  . Years of education: N/A   Occupational History  . Not on file.   Social History Main Topics  . Smoking status: Never Smoker  . Smokeless tobacco: Never Used  . Alcohol use No  . Drug use: No  . Sexual activity: Yes    Partners: Male    Birth control/ protection: None   Other Topics Concern  . Not on file   Social History Narrative  . No narrative on file   Codeine    Prenatal Transfer Tool  Maternal Diabetes: Yes:  Diabetes Type:  Insulin/Medication controlled Genetic Screening: Normal Maternal Ultrasounds/Referrals: Normal Fetal Ultrasounds or other Referrals:  None Maternal Substance Abuse:  No Significant Maternal Medications:  Meds include: Other: Glyburide then changed to insulin Significant Maternal Lab Results: None  Other PNC: Otherwise uncomplicated.    There were no vitals filed for this visit.   Lungs/Cor:  NAD Abdomen:  soft, gravid Ex:  no cords, erythema SVE:  2.5/50/-2 FHTs:  120s, good STV, NST R Toco:  q occ   A/P   Term induction for A2GDM.  GBS  neg.  Eva Vallee A

## 2017-02-16 NOTE — Anesthesia Preprocedure Evaluation (Signed)
Anesthesia Evaluation  Patient identified by MRN, date of birth, ID band Patient awake    Reviewed: Allergy & Precautions, H&P , NPO status , Patient's Chart, lab work & pertinent test results  Airway Mallampati: II  TM Distance: >3 FB Neck ROM: full    Dental no notable dental hx.    Pulmonary neg pulmonary ROS,    Pulmonary exam normal breath sounds clear to auscultation       Cardiovascular negative cardio ROS Normal cardiovascular exam Rhythm:regular Rate:Normal     Neuro/Psych negative neurological ROS     GI/Hepatic negative GI ROS, Neg liver ROS,   Endo/Other  negative endocrine ROSdiabetes, Gestational, Insulin Dependent  Renal/GU negative Renal ROS     Musculoskeletal Scoliosis with Harrington rods that run fromT3-L1   Abdominal (+) + obese,   Peds  Hematology negative hematology ROS (+)   Anesthesia Other Findings   Reproductive/Obstetrics (+) Pregnancy                             Anesthesia Physical  Anesthesia Plan  ASA: II  Anesthesia Plan: Epidural   Post-op Pain Management:    Induction:   Airway Management Planned:   Additional Equipment:   Intra-op Plan:   Post-operative Plan:   Informed Consent: I have reviewed the patients History and Physical, chart, labs and discussed the procedure including the risks, benefits and alternatives for the proposed anesthesia with the patient or authorized representative who has indicated his/her understanding and acceptance.     Plan Discussed with:   Anesthesia Plan Comments:         Anesthesia Quick Evaluation

## 2017-02-16 NOTE — Anesthesia Procedure Notes (Signed)
Epidural Patient location during procedure: OB Start time: 02/16/2017 9:35 AM End time: 02/16/2017 9:39 AM  Staffing Anesthesiologist: Leilani AbleHATCHETT, Lucielle Vokes Performed: anesthesiologist   Preanesthetic Checklist Completed: patient identified, surgical consent, pre-op evaluation, timeout performed, IV checked, risks and benefits discussed and monitors and equipment checked  Epidural Patient position: sitting Prep: site prepped and draped and DuraPrep Patient monitoring: continuous pulse ox and blood pressure Approach: midline Location: L3-L4 Injection technique: LOR air  Needle:  Needle type: Tuohy  Needle gauge: 17 G Needle length: 9 cm and 9 Needle insertion depth: 5 cm cm Catheter type: closed end flexible Catheter size: 19 Gauge Catheter at skin depth: 10 cm Test dose: negative and Other  Assessment Sensory level: T9 Events: blood not aspirated, injection not painful, no injection resistance, negative IV test and no paresthesia  Additional Notes Reason for block:procedure for pain

## 2017-02-16 NOTE — Anesthesia Pain Management Evaluation Note (Signed)
  CRNA Pain Management Visit Note  Patient: Lauren OpitzBriana L Hunter, 21 y.o., female  "Hello I am a [facility] of the anesthesia team at [facility]. We have an anesthesia team available at all times to provide care throughout the hospital, including epidural management and anesthesia for C-section. I don't know your plan for the delivery whether it a natural birth, water birth, IV sedation, nitrous supplementation, doula or epidural, but we want to meet your pain goals."   1.Was your pain managed to your expectations on prior hospitalizations?   Yes   2.What is your expectation for pain management during this hospitalization?     Epidural  3.How can we help you reach that goal?   Record the patient's initial score and the patient's pain goal.   Pain: 2  Pain Goal: 3 [facility] wants you to be able to say your pain was always managed very well.  Laban EmperorMalinova,Hamlin Devine Hristova 02/16/2017

## 2017-02-17 LAB — GLUCOSE, CAPILLARY
GLUCOSE-CAPILLARY: 173 mg/dL — AB (ref 65–99)
Glucose-Capillary: 89 mg/dL (ref 65–99)
Glucose-Capillary: 86 mg/dL (ref 65–99)
Glucose-Capillary: 109 mg/dL — ABNORMAL HIGH (ref 65–99)

## 2017-02-17 LAB — CBC
HCT: 26.9 % — ABNORMAL LOW (ref 36.0–46.0)
Hemoglobin: 8.8 g/dL — ABNORMAL LOW (ref 12.0–15.0)
MCH: 25.4 pg — AB (ref 26.0–34.0)
MCHC: 32.7 g/dL (ref 30.0–36.0)
MCV: 77.5 fL — ABNORMAL LOW (ref 78.0–100.0)
PLATELETS: 149 10*3/uL — AB (ref 150–400)
RBC: 3.47 MIL/uL — ABNORMAL LOW (ref 3.87–5.11)
RDW: 14.8 % (ref 11.5–15.5)
WBC: 7.8 10*3/uL (ref 4.0–10.5)

## 2017-02-17 LAB — RPR: RPR Ser Ql: NONREACTIVE

## 2017-02-17 NOTE — Anesthesia Postprocedure Evaluation (Signed)
Anesthesia Post Note  Patient: Raynaldo OpitzBriana L Hearns  Procedure(s) Performed: * No procedures listed *  Patient location during evaluation: Mother Baby Anesthesia Type: Epidural Level of consciousness: awake and alert Pain management: pain level controlled Vital Signs Assessment: post-procedure vital signs reviewed and stable Respiratory status: spontaneous breathing Cardiovascular status: blood pressure returned to baseline Postop Assessment: no headache, patient able to bend at knees, no backache, no signs of nausea or vomiting, epidural receding and adequate PO intake Anesthetic complications: no        Last Vitals:  Vitals:   02/17/17 0551 02/17/17 0629  BP: 114/68 109/69  Pulse: 74 76  Resp: 18 18  Temp: 36.8 C 36.4 C    Last Pain:  Vitals:   02/17/17 0900  TempSrc:   PainSc: 5    Pain Goal: Patients Stated Pain Goal: 2 (02/17/17 0126)               Salome ArntSterling, Lori-Ann Lindfors Marie

## 2017-02-17 NOTE — Progress Notes (Signed)
CSW received consult for hx of Anxiety.  CSW attempted to meet with MOB to complete assessment, but she was asleep at this time.  FOB asked that CSW return at a later time.

## 2017-02-17 NOTE — Lactation Note (Signed)
This note was copied from a baby's chart. Lactation Consultation Note  Patient Name: Lauren Myrtie NeitherBriana Hunter XLKGM'WToday's Date: 02/17/2017 Reason for consult: Follow-up assessment Mom is latching baby independently, baby starting to cluster feed. Encouraged to continue to BF with feeding ques, 8-12 times or more in 24 hours. Lactation brochure left for review, advised of OP services and support group. Encouraged to call for questions/concerns or assist as needed.   Maternal Data Has patient been taught Hand Expression?: Yes Does the patient have breastfeeding experience prior to this delivery?: Yes  Feeding Feeding Type: Breast Fed Length of feed: 5 min  LATCH Score/Interventions Latch: Grasps breast easily, tongue down, lips flanged, rhythmical sucking. Intervention(s): Breast massage  Audible Swallowing: A few with stimulation  Type of Nipple: Everted at rest and after stimulation  Comfort (Breast/Nipple): Soft / non-tender     Hold (Positioning): No assistance needed to correctly position infant at breast.  LATCH Score: 9  Lactation Tools Discussed/Used WIC Program: No   Consult Status Consult Status: Follow-up Date: 02/18/17 Follow-up type: In-patient    Alfred LevinsGranger, Matsuko Kretz Ann 02/17/2017, 9:45 AM

## 2017-02-17 NOTE — Progress Notes (Signed)
FSBS 173. Patient stated,"I ate a donut and drank coffee. I was so hungry and I couldn't help it." patient ate this around 0332. Instructions given again about fasting for morning blood sugar.

## 2017-02-17 NOTE — Progress Notes (Signed)
Post Partum Day 1 Subjective: voiding, tolerating PO, + flatus and cramping pain  Objective: Blood pressure 109/69, pulse 76, temperature 97.5 F (36.4 C), temperature source Oral, resp. rate 18, height 5\' 4"  (1.626 m), weight 84.8 kg (187 lb), unknown if currently breastfeeding.  Physical Exam:  General: alert, cooperative and no distress Lochia: appropriate Uterine Fundus: firm perineum: healing well, no significant drainage, no dehiscence, no significant erythema DVT Evaluation: No evidence of DVT seen on physical exam. Negative Homan's sign. No cords or calf tenderness. No significant calf/ankle edema.   Recent Labs  02/16/17 0735 02/17/17 0654  HGB 10.7* 8.8*  HCT 32.8* 26.9*    Assessment/Plan: Plan for discharge tomorrow and Breastfeeding  Patient counseled to ask for pain medication more regularly   LOS: 1 day   Noach Calvillo STACIA 02/17/2017, 9:06 AM

## 2017-02-18 MED ORDER — OXYCODONE-ACETAMINOPHEN 5-325 MG PO TABS: 1.0000 | ORAL_TABLET | ORAL | 0 refills | Status: DC | PRN

## 2017-02-18 MED ORDER — DOCUSATE SODIUM 100 MG PO CAPS: 100.0000 mg | ORAL_CAPSULE | Freq: Two times a day (BID) | ORAL | 0 refills | Status: DC

## 2017-02-18 MED ORDER — IBUPROFEN 600 MG PO TABS: 600.0000 mg | ORAL_TABLET | Freq: Four times a day (QID) | ORAL | 0 refills | Status: DC | PRN

## 2017-02-18 NOTE — Lactation Note (Signed)
This note was copied from a baby's chart. Lactation Consultation Note  Patient Name: Lauren Hunter XBJYN'WToday's Date: 02/18/2017 Reason for consult: Follow-up assessment;Breast/nipple pain   Follow up with mom of 41 hour old infant. Infant with 8 BF for 8-10 minutes, 1 void (3 in life) and 3 (5 in life) stools in last 24 hours. LATCH Scores 8-9. Mom reports infant is doing ok with BF and that she has a good latch, she reports infant is sleepy at the breast.   Mom had infant latched to left breast in the cradle hold fully clothed. Mom reports she is experiencing slight pain/pinching throughout most feedings. Infant was noted to be latched shallowly with minimal head support. Enc mom to keep infant pulled in close to the breast. Infant nursing intermittently with a few swallows noted. Swallows increased with breast compression/massage. Infant self detached and mom's nipple was noted to be compressed and creased. Mom is noted to have very faint positional stripes to both nipples. Discussed importance of deep latch to improve milk transfer and to limit nipple trauma. Mom is using EBM and coconut oil to nipples. Mom voiced understanding.   Discussed STS and awakening techniques as ways to keep infant at the breast. When mom relatched infant to right breast she latched in the cross cradle hold with good head support. When infant came off nipple was noted to be rounded. Mom reported increased comfort with the latch. Mom is independent with latching infant.   Mom asked about taking Percocet and dietary restrictions, reviewed with mom. Mom asked about taking infant to church on Sunday, discussed that we are in the cold and flu season and being around large crowds so early may not be a good idea for infant, mom aware she can call and discuss with Pediatrician. LC Brochure, Reviewed OP Services, BF Support Groups and LC phone #. Enc mom to call with questions/concerns or if nipple tenderness is not improving over  the next few days. Reviewed I/O, Engorgement prevention/treatment and breast milk handling and storage. Mom without further questions/concerns.    Maternal Data Formula Feeding for Exclusion: No Has patient been taught Hand Expression?: Yes Does the patient have breastfeeding experience prior to this delivery?: Yes  Feeding Feeding Type: Breast Fed Length of feed: 20 min  LATCH Score/Interventions Latch: Repeated attempts needed to sustain latch, nipple held in mouth throughout feeding, stimulation needed to elicit sucking reflex. Intervention(s): Skin to skin;Teach feeding cues;Waking techniques Intervention(s): Adjust position;Assist with latch;Breast massage;Breast compression  Audible Swallowing: A few with stimulation (increased with Compression/massage of the breast) Intervention(s): Skin to skin;Hand expression;Alternate breast massage  Type of Nipple: Everted at rest and after stimulation  Comfort (Breast/Nipple): Filling, red/small blisters or bruises, mild/mod discomfort  Problem noted: Mild/Moderate discomfort Interventions  (Cracked/bleeding/bruising/blister): Expressed breast milk to nipple;Other (comment) (Coconut oil) Interventions (Mild/moderate discomfort): Hand expression  Hold (Positioning): No assistance needed to correctly position infant at breast.  LATCH Score: 7  Lactation Tools Discussed/Used WIC Program: No   Consult Status Consult Status: Complete Follow-up type: Call as needed    Ed BlalockSharon S Hice 02/18/2017, 10:34 AM

## 2017-02-18 NOTE — Discharge Summary (Signed)
Obstetric Discharge Summary Reason for Admission: induction of labor Prenatal Procedures: NST and ultrasound Intrapartum Procedures: spontaneous vaginal delivery Postpartum Procedures: none Complications-Operative and Postpartum: none Hemoglobin  Date Value Ref Range Status  02/17/2017 8.8 (L) 12.0 - 15.0 g/dL Final   HCT  Date Value Ref Range Status  02/17/2017 26.9 (L) 36.0 - 46.0 % Final    Physical Exam:  General: alert, cooperative and appears stated age 71Lochia: appropriate Uterine Fundus: firm DVT Evaluation: No evidence of DVT seen on physical exam.  Discharge Diagnoses: Term Pregnancy-delivered  Discharge Information: Date: 02/18/2017 Activity: pelvic rest Diet: routine Medications: Ibuprofen, Colace and Percocet Condition: improved Instructions: refer to practice specific booklet Discharge to: home Follow-up Information    [name],[name], MD Follow up.   Specialty:  Obstetrics and Gynecology Contact information: [address] [place] [id number] [phone]           Newborn Data: Live born female  Birth Weight: 7 lb 12.2 oz (3520 g) APGAR: 9, 9  Home with mother.  Lauren Hunter H. 02/18/2017, 9:36 AM

## 2017-02-18 NOTE — Lactation Note (Signed)
This note was copied from a baby's chart. Lactation Consultation Note  Patient Name: Lauren Hunter: 02/18/2017 Reason for consult: Follow-up assessment;Breast/nipple pain   Follow up with mom at request of Social Worker, Lauren Hunter. Mom was concerned about taking Prozac and BF, showed mom information from Lauren Hunter on Prozac and Zantac. Mom voiced understanding. Enc mom to call with further questions/concerns.    Maternal Data Formula Feeding for Exclusion: No Has patient been taught Hand Expression?: Yes Does the patient have breastfeeding experience prior to this delivery?: Yes  Feeding Feeding Type: Breast Fed Length of feed: 20 min  LATCH Score/Interventions Latch: Repeated attempts needed to sustain latch, nipple held in mouth throughout feeding, stimulation needed to elicit sucking reflex. Intervention(s): Skin to skin;Teach feeding cues;Waking techniques Intervention(s): Adjust position;Assist with latch;Breast massage;Breast compression  Audible Swallowing: A few with stimulation (increased with Compression/massage of the breast) Intervention(s): Skin to skin;Hand expression;Alternate breast massage  Type of Nipple: Everted at rest and after stimulation  Comfort (Breast/Nipple): Filling, red/small blisters or bruises, mild/mod discomfort  Problem noted: Mild/Moderate discomfort Interventions  (Cracked/bleeding/bruising/blister): Expressed breast milk to nipple;Other (comment) (Coconut oil) Interventions (Mild/moderate discomfort): Hand expression  Hold (Positioning): No assistance needed to correctly position infant at breast.  LATCH Score: 7  Lactation Tools Discussed/Used WIC Program: No   Consult Status Consult Status: Complete Follow-up type: Call as needed    Lauren Hunter 02/18/2017, 11:01 AM
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
## 2017-10-11 ENCOUNTER — Encounter (HOSPITAL_COMMUNITY): Payer: Self-pay

## 2018-03-26 LAB — GLUCOSE, POCT (MANUAL RESULT ENTRY): POC GLUCOSE: 104 mg/dL — AB (ref 70–99)

## 2019-03-21 ENCOUNTER — Encounter (HOSPITAL_BASED_OUTPATIENT_CLINIC_OR_DEPARTMENT_OTHER): Payer: Self-pay

## 2019-03-21 ENCOUNTER — Other Ambulatory Visit: Payer: Self-pay

## 2019-03-27 ENCOUNTER — Other Ambulatory Visit: Payer: Self-pay

## 2019-03-27 ENCOUNTER — Encounter (HOSPITAL_BASED_OUTPATIENT_CLINIC_OR_DEPARTMENT_OTHER): Payer: Self-pay | Admitting: *Deleted

## 2019-03-27 ENCOUNTER — Ambulatory Visit (HOSPITAL_BASED_OUTPATIENT_CLINIC_OR_DEPARTMENT_OTHER): Payer: Managed Care, Other (non HMO) | Admitting: Anesthesiology

## 2019-03-27 ENCOUNTER — Encounter (HOSPITAL_BASED_OUTPATIENT_CLINIC_OR_DEPARTMENT_OTHER)
Admission: RE | Disposition: A | Discharge status: Home / Self Care | Payer: Self-pay | Source: Home / Self Care | Attending: Obstetrics and Gynecology

## 2019-03-27 ENCOUNTER — Ambulatory Visit (HOSPITAL_BASED_OUTPATIENT_CLINIC_OR_DEPARTMENT_OTHER)
Admission: RE | Admit: 2019-03-27 | Discharge: 2019-03-27 | Disposition: A | Discharge status: Home / Self Care | Payer: Managed Care, Other (non HMO) | Attending: Obstetrics and Gynecology | Admitting: Obstetrics and Gynecology

## 2019-03-27 ENCOUNTER — Other Ambulatory Visit: Payer: Self-pay | Admitting: Obstetrics and Gynecology

## 2019-03-27 DIAGNOSIS — F419 Anxiety disorder, unspecified: Secondary | ICD-10-CM | POA: Diagnosis not present

## 2019-03-27 DIAGNOSIS — O021 Missed abortion: Secondary | ICD-10-CM | POA: Insufficient documentation

## 2019-03-27 HISTORY — PX: DILATION AND EVACUATION: SHX1459

## 2019-03-27 LAB — CBC
HCT: 38.8 % (ref 36.0–46.0)
Hemoglobin: 14.1 g/dL (ref 12.0–15.0)
MCH: 30.9 pg (ref 26.0–34.0)
MCHC: 36.3 g/dL — ABNORMAL HIGH (ref 30.0–36.0)
MCV: 85.1 fL (ref 80.0–100.0)
Platelets: 207 10*3/uL (ref 150–400)
RBC: 4.56 MIL/uL (ref 3.87–5.11)
RDW: 12.2 % (ref 11.5–15.5)
WBC: 5.6 10*3/uL (ref 4.0–10.5)
nRBC: 0 % (ref 0.0–0.2)

## 2019-03-27 LAB — TYPE AND SCREEN
ABO/RH(D): B POS
Antibody Screen: NEGATIVE

## 2019-03-27 SURGERY — DILATION AND EVACUATION, UTERUS
Anesthesia: General | Site: Vagina

## 2019-03-27 MED ORDER — FENTANYL CITRATE (PF) 100 MCG/2ML IJ SOLN
50.0000 ug | INTRAMUSCULAR | Status: DC | PRN
  Administered 2019-03-27 (×2): 50 ug via INTRAVENOUS

## 2019-03-27 MED ORDER — IBUPROFEN 600 MG PO TABS: 600.0000 mg | ORAL_TABLET | Freq: Four times a day (QID) | ORAL | 0 refills | Status: DC | PRN

## 2019-03-27 MED ORDER — LIDOCAINE HCL (CARDIAC) PF 100 MG/5ML IV SOSY
PREFILLED_SYRINGE | INTRAVENOUS | Status: DC | PRN
  Administered 2019-03-27: 100 mg via INTRATRACHEAL

## 2019-03-27 MED ORDER — SCOPOLAMINE 1 MG/3DAYS TD PT72
1.0000 | MEDICATED_PATCH | Freq: Once | TRANSDERMAL | Status: AC | PRN
  Administered 2019-03-27: 1 via TRANSDERMAL

## 2019-03-27 MED ORDER — ONDANSETRON HCL 4 MG/2ML IJ SOLN
INTRAMUSCULAR | Status: AC
  Filled 2019-03-27: qty 2

## 2019-03-27 MED ORDER — LIDOCAINE HCL (PF) 1 % IJ SOLN
INTRAMUSCULAR | Status: AC
  Filled 2019-03-27: qty 30

## 2019-03-27 MED ORDER — PROPOFOL 10 MG/ML IV BOLUS
INTRAVENOUS | Status: AC
  Filled 2019-03-27: qty 20

## 2019-03-27 MED ORDER — PROPOFOL 10 MG/ML IV BOLUS
INTRAVENOUS | Status: DC | PRN
  Administered 2019-03-27: 200 mg via INTRAVENOUS

## 2019-03-27 MED ORDER — KETOROLAC TROMETHAMINE 30 MG/ML IJ SOLN
INTRAMUSCULAR | Status: AC
  Filled 2019-03-27: qty 1

## 2019-03-27 MED ORDER — LACTATED RINGERS IV SOLN
INTRAVENOUS | Status: DC
  Administered 2019-03-27: 15:00:00 via INTRAVENOUS

## 2019-03-27 MED ORDER — DEXAMETHASONE SODIUM PHOSPHATE 10 MG/ML IJ SOLN
INTRAMUSCULAR | Status: DC | PRN
  Administered 2019-03-27: 10 mg via INTRAVENOUS

## 2019-03-27 MED ORDER — DEXAMETHASONE SODIUM PHOSPHATE 10 MG/ML IJ SOLN
INTRAMUSCULAR | Status: AC
  Filled 2019-03-27: qty 1

## 2019-03-27 MED ORDER — LIDOCAINE 2% (20 MG/ML) 5 ML SYRINGE
INTRAMUSCULAR | Status: AC
  Filled 2019-03-27: qty 5

## 2019-03-27 MED ORDER — FENTANYL CITRATE (PF) 100 MCG/2ML IJ SOLN
INTRAMUSCULAR | Status: AC
  Filled 2019-03-27: qty 2

## 2019-03-27 MED ORDER — HYDROCODONE-ACETAMINOPHEN 5-325 MG PO TABS: ORAL_TABLET | ORAL | 0 refills | Status: DC

## 2019-03-27 MED ORDER — ONDANSETRON HCL 4 MG/2ML IJ SOLN
INTRAMUSCULAR | Status: DC | PRN
  Administered 2019-03-27: 4 mg via INTRAVENOUS

## 2019-03-27 MED ORDER — LIDOCAINE HCL 1 % IJ SOLN
INTRAMUSCULAR | Status: DC | PRN
  Administered 2019-03-27: 10 mL

## 2019-03-27 MED ORDER — MIDAZOLAM HCL 2 MG/2ML IJ SOLN
1.0000 mg | INTRAMUSCULAR | Status: DC | PRN
  Administered 2019-03-27: 2 mg via INTRAVENOUS

## 2019-03-27 MED ORDER — LACTATED RINGERS IV SOLN
INTRAVENOUS | Status: DC
  Administered 2019-03-27: 12:00:00 via INTRAVENOUS

## 2019-03-27 MED ORDER — MIDAZOLAM HCL 2 MG/2ML IJ SOLN
INTRAMUSCULAR | Status: AC
  Filled 2019-03-27: qty 2

## 2019-03-27 SURGICAL SUPPLY — 21 items
BRIEF STRETCH MATERNITY 2XLG (MISCELLANEOUS) ×3 IMPLANT
CATH ROBINSON RED A/P 16FR (CATHETERS) ×3 IMPLANT
DECANTER SPIKE VIAL GLASS SM (MISCELLANEOUS) ×1 IMPLANT
DILATOR CANAL MILEX (MISCELLANEOUS) IMPLANT
GLOVE BIO SURGEON STRL SZ7 (GLOVE) ×3 IMPLANT
GLOVE BIOGEL PI IND STRL 7.0 (GLOVE) ×1 IMPLANT
GLOVE BIOGEL PI INDICATOR 7.0 (GLOVE) ×2
GOWN STRL REUS W/ TWL LRG LVL3 (GOWN DISPOSABLE) ×2 IMPLANT
GOWN STRL REUS W/TWL LRG LVL3 (GOWN DISPOSABLE) ×6
HIBICLENS CHG 4% 4OZ BTL (MISCELLANEOUS) ×1 IMPLANT
KIT BERKELEY 1ST TRIMESTER 3/8 (MISCELLANEOUS) ×3 IMPLANT
NS IRRIG 1000ML POUR BTL (IV SOLUTION) ×3 IMPLANT
PACK VAGINAL MINOR WOMEN LF (CUSTOM PROCEDURE TRAY) ×3 IMPLANT
PAD OB MATERNITY 4.3X12.25 (PERSONAL CARE ITEMS) ×3 IMPLANT
PAD PREP 24X48 CUFFED NSTRL (MISCELLANEOUS) ×3 IMPLANT
SET BERKELEY SUCTION TUBING (SUCTIONS) ×3 IMPLANT
TOWEL OR 17X24 6PK STRL BLUE (TOWEL DISPOSABLE) ×3 IMPLANT
VACURETTE 10 RIGID CVD (CANNULA) IMPLANT
VACURETTE 7MM CVD STRL WRAP (CANNULA) IMPLANT
VACURETTE 8 RIGID CVD (CANNULA) ×2 IMPLANT
VACURETTE 9 RIGID CVD (CANNULA) IMPLANT

## 2019-03-27 NOTE — Discharge Instructions (Signed)
°  Post Anesthesia Home Care Instructions  Activity: Get plenty of rest for the remainder of the day. A responsible adult should stay with you for 24 hours following the procedure.  For the next 24 hours, DO NOT: -Drive a car -Advertising copywriter -Drink alcoholic beverages -Take any medication unless instructed by your physician -Make any legal decisions or sign important papers.  Meals: Start with liquid foods such as gelatin or soup. Progress to regular foods as tolerated. Avoid greasy, spicy, heavy foods. If nausea and/or vomiting occur, drink only clear liquids until the nausea and/or vomiting subsides. Call your physician if vomiting continues.  Special Instructions/Symptoms: Your throat may feel dry or sore from the anesthesia or the breathing tube placed in your throat during surgery. If this causes discomfort, gargle with warm salt water. The discomfort should disappear within 24 hours.  If you had a scopolamine patch placed behind your ear for the management of post- operative nausea and/or vomiting:  1. The medication in the patch is effective for 72 hours, after which it should be removed.  Wrap patch in a tissue and discard in the trash. Wash hands thoroughly with soap and water. 2. You may remove the patch earlier than 72 hours if you experience unpleasant side effects which may include dry mouth, dizziness or visual disturbances. 3. Avoid touching the patch. Wash your hands with soap and water after contact with the patch.     For emotional support and/or grief counseling, feel free to contact the [facility] Chaplain services:  [phone]

## 2019-03-27 NOTE — Transfer of Care (Signed)
Immediate Anesthesia Transfer of Care Note  Patient: Lauren Hunter  Procedure(s) Performed: DILATATION AND EVACUATION (N/A )  Patient Location: PACU  Anesthesia Type:General  Level of Consciousness: awake, alert  and oriented  Airway & Oxygen Therapy: Patient Spontanous Breathing and Patient connected to face mask oxygen  Post-op Assessment: Report given to RN and Post -op Vital signs reviewed and stable  Post vital signs: Reviewed and stable  Last Vitals:  Vitals Value Taken Time  BP    Temp    Pulse    Resp 11 03/27/2019  2:09 PM  SpO2    Vitals shown include unvalidated device data.  Last Pain:  Vitals:   03/27/19 1127  TempSrc: Oral  PainSc: 0-No pain      Patients Stated Pain Goal: 2 (03/27/19 1127)  Complications: No apparent anesthesia complications

## 2019-03-27 NOTE — H&P (Signed)
Lauren Hunter is an 23 y.o. female.   23 yo G3P2002 Presents for suction D&E for Missed miscarriage. By LMP she should be 9 weeks. However during her first prenatal visit in the office 2 weeks ago an ultrasound demonstrated an empty gestational sac with yolk sac. No fetal pole was identified. Mean sac diameter met criteria for anembryonic pregnancy. Management options were discussed with patient and she wishes to proceed with surgical management. R/B/A reviewed  No LMP recorded. (Menstrual status: Other).    Past Medical History:  Diagnosis Date  . Allergy   . Anxiety   . Scoliosis     Past Surgical History:  Procedure Laterality Date  . BACK SURGERY    . INCISION AND DRAINAGE     MRSA related R thumb 2009 and L toe 2003  . Scoliosis-Harrington Rods      Family History  Problem Relation Age of Onset  . Crohn's disease Mother   . Heart failure Mother   . Diabetes Paternal Grandmother   . Diabetes Paternal Grandfather     Social History:  reports that she has never smoked. She has never used smokeless tobacco. She reports that she does not drink alcohol or use drugs.  Allergies:  Allergies  Allergen Reactions  . Codeine Swelling and Rash    Medications Prior to Admission  Medication Sig Dispense Refill Last Dose  . acetaminophen (TYLENOL) 325 MG tablet Take 650 mg by mouth every 6 (six) hours as needed.   03/26/2019 at Unknown time  . FLUoxetine (PROZAC) 20 MG tablet Take 20 mg by mouth daily.   03/27/2019 at Unknown time    ROS  Blood pressure 122/63, pulse 75, temperature 98.3 F (36.8 C), temperature source Oral, resp. rate 18, height 5' 4"  (1.626 m), weight 74.6 kg, unknown if currently breastfeeding. Physical Exam  AOX3, NAD Normal work of breathing  Abd soft  Results for orders placed or performed during the hospital encounter of 03/27/19 (from the past 24 hour(s))  CBC     Status: Abnormal   Collection Time: 03/27/19 11:15 AM  Result Value Ref Range   WBC  5.6 4.0 - 10.5 K/uL   RBC 4.56 3.87 - 5.11 MIL/uL   Hemoglobin 14.1 12.0 - 15.0 g/dL   HCT 38.8 36.0 - 46.0 %   MCV 85.1 80.0 - 100.0 fL   MCH 30.9 26.0 - 34.0 pg   MCHC 36.3 (H) 30.0 - 36.0 g/dL   RDW 12.2 11.5 - 15.5 %   Platelets 207 150 - 400 K/uL   nRBC 0.0 0.0 - 0.2 %  Type and screen     Status: None (Preliminary result)   Collection Time: 03/27/19 11:15 AM  Result Value Ref Range   ABO/RH(D) B POS    Antibody Screen PENDING    Sample Expiration      03/30/2019 Performed at [facility], [address], [place], [place] [address]     No results found.  Assessment/Plan: 1) Proceed with outpatient suction D&E 2) SCDs for DVT prophylaxis 3) Informed consent obtained  Vanessa Kick 03/27/2019, 12:02 PM

## 2019-03-27 NOTE — Anesthesia Preprocedure Evaluation (Signed)
Anesthesia Evaluation  Patient identified by MRN, date of birth, ID band Patient awake    Reviewed: Allergy & Precautions, NPO status , Patient's Chart, lab work & pertinent test results  Airway Mallampati: II  TM Distance: >3 FB Neck ROM: Full    Dental no notable dental hx.    Pulmonary neg pulmonary ROS,    Pulmonary exam normal breath sounds clear to auscultation       Cardiovascular negative cardio ROS Normal cardiovascular exam Rhythm:Regular Rate:Normal     Neuro/Psych negative neurological ROS  negative psych ROS   GI/Hepatic negative GI ROS, Neg liver ROS,   Endo/Other  negative endocrine ROS  Renal/GU negative Renal ROS  negative genitourinary   Musculoskeletal negative musculoskeletal ROS (+)   Abdominal   Peds negative pediatric ROS (+)  Hematology negative hematology ROS (+)   Anesthesia Other Findings   Reproductive/Obstetrics negative OB ROS                             Anesthesia Physical Anesthesia Plan  ASA: I  Anesthesia Plan: General   Post-op Pain Management:    Induction: Intravenous  PONV Risk Score and Plan: 3 and Ondansetron, Dexamethasone, Midazolam and Treatment may vary due to age or medical condition  Airway Management Planned: LMA  Additional Equipment:   Intra-op Plan:   Post-operative Plan: Extubation in OR  Informed Consent: I have reviewed the patients History and Physical, chart, labs and discussed the procedure including the risks, benefits and alternatives for the proposed anesthesia with the patient or authorized representative who has indicated his/her understanding and acceptance.   Dental advisory given  Plan Discussed with: CRNA and Surgeon  Anesthesia Plan Comments:         Anesthesia Quick Evaluation  

## 2019-03-27 NOTE — Op Note (Signed)
Pre-Operative Diagnosis: 1) 7 week missed miscarriage Postoperative Diagnosis: 1) 7 week missed miscarriage Procedure: Suction Dilation and Evacuation Surgeon: Dr. Waynard Reeds Assistant: None Operative Findings: POCs Specimen: POCs EBL: minimal  Ms. Colunga Is a 23 year old gravida 3 para 2002 who presents for definitive surgical management for 7 week missed miscarriage. Please see the patient's history and physical for complete details of the history. Management options were discussed with the patient. R/B/A reviewed. Following appropriate informed consent was taken to the operating room. The patient was appropriately identified during a time out procedure. General anesthesia with LMA was administered and the patient was placed in the dorsal lithotomy position. The patient was prepped and draped in the normal sterile fashion. A speculum was placed into the vagina, a single-tooth tenaculum was placed on the anterior lip of the cervix, and 10 cc of 1% lidocaine was administered in a paracervical fashion. The cervix was serially dilated with Hank dilators. A #8 suction curet was then passed to the fundus, the vacuum was engaged, and 3 suction passes were performed with a curette. A Sharp curettage was performed and a gritty texture was noted. A final suction pass was performed with minimal results. This completed the procedure. The patient tolerated the procedure well was brought to the recovery room in stable condition for the procedure. All sponge and needle counts correct. The patient was transferred to the PACU in stable condition after the procedure

## 2019-03-27 NOTE — Progress Notes (Signed)
Pt received into PACU.  Very emotional, crying, very apologetic.  Emotional support provided, encouraged slow deep breaths through nose.

## 2019-03-27 NOTE — Anesthesia Procedure Notes (Signed)
Procedure Name: LMA Insertion Date/Time: 03/27/2019 1:31 PM Performed by: Ronnette Hila, CRNA Pre-anesthesia Checklist: Patient identified, Emergency Drugs available, Suction available and Patient being monitored Patient Re-evaluated:Patient Re-evaluated prior to induction Oxygen Delivery Method: Circle system utilized Preoxygenation: Pre-oxygenation with 100% oxygen Induction Type: IV induction Ventilation: Mask ventilation without difficulty LMA: LMA inserted LMA Size: 4.0 Number of attempts: 1 Airway Equipment and Method: Bite block Placement Confirmation: positive ETCO2 Tube secured with: Tape Dental Injury: Teeth and Oropharynx as per pre-operative assessment

## 2019-03-27 NOTE — Progress Notes (Signed)
Less crying, able to speak softly. Cool cloth given to wipe tears. Pt encouraged to close eyes and rest for a minute. Encouraged to focus on slow deep breaths through nose. Pt able to verbalize more .  Pt informed that grieving is a process and her behavior is appropriate considering her loss.  Pt verbalized her appreciation.Marland Kitchen

## 2019-03-27 NOTE — Anesthesia Postprocedure Evaluation (Signed)
Anesthesia Post Note  Patient: Lauren Hunter  Procedure(s) Performed: DILATATION AND EVACUATION (N/A Vagina )     Patient location during evaluation: PACU Anesthesia Type: General Level of consciousness: awake and alert Pain management: pain level controlled Vital Signs Assessment: post-procedure vital signs reviewed and stable Respiratory status: spontaneous breathing, nonlabored ventilation, respiratory function stable and patient connected to nasal cannula oxygen Cardiovascular status: blood pressure returned to baseline and stable Postop Assessment: no apparent nausea or vomiting Anesthetic complications: no    Last Vitals:  Vitals:   03/27/19 1415 03/27/19 1430  BP: (!) 136/96 129/89  Pulse: (!) 110 74  Resp: 17 19  Temp:    SpO2: 100% 99%    Last Pain:  Vitals:   03/27/19 1430  TempSrc:   PainSc: 0-No pain                 Katey Barrie S

## 2019-03-28 ENCOUNTER — Encounter (HOSPITAL_BASED_OUTPATIENT_CLINIC_OR_DEPARTMENT_OTHER): Payer: Self-pay | Admitting: Obstetrics and Gynecology

## 2019-03-28 NOTE — Addendum Note (Signed)
Addendum  created 03/28/19 0840 by Lance Coon, CRNA   Charge Capture section accepted

## 2019-10-04 ENCOUNTER — Other Ambulatory Visit: Payer: Self-pay

## 2019-10-04 DIAGNOSIS — Z20822 Contact with and (suspected) exposure to covid-19: Secondary | ICD-10-CM

## 2019-10-05 LAB — NOVEL CORONAVIRUS, NAA: SARS-CoV-2, NAA: NOT DETECTED

## 2019-10-08 DIAGNOSIS — U071 COVID-19: Secondary | ICD-10-CM

## 2019-10-08 HISTORY — DX: COVID-19: U07.1

## 2020-01-26 MED ORDER — LACTATED RINGERS IV SOLN
1000.00 | INTRAVENOUS | Status: DC
Start: ? — End: 2020-01-26

## 2020-01-26 MED ORDER — BUTORPHANOL TARTRATE 1 MG/ML IJ SOLN
1.00 | INTRAMUSCULAR | Status: DC
Start: ? — End: 2020-01-26

## 2020-01-26 MED ORDER — LEVOTHYROXINE SODIUM 50 MCG PO TABS
50.00 | ORAL_TABLET | ORAL | Status: DC
Start: 2020-01-27 — End: 2020-01-26

## 2020-01-26 MED ORDER — NALOXONE HCL 4 MG/10ML IJ SOLN
0.10 | INTRAMUSCULAR | Status: DC
Start: ? — End: 2020-01-26

## 2020-01-26 MED ORDER — SOD CITRATE-CITRIC ACID 500-334 MG/5ML PO SOLN
30.00 | ORAL | Status: DC
Start: ? — End: 2020-01-26

## 2020-01-26 MED ORDER — LACTATED RINGERS IV SOLN
500.00 | INTRAVENOUS | Status: DC
Start: ? — End: 2020-01-26

## 2020-01-26 MED ORDER — HYDRALAZINE HCL 20 MG/ML IJ SOLN
5.00 | INTRAMUSCULAR | Status: DC
Start: ? — End: 2020-01-26

## 2020-01-26 MED ORDER — LIDOCAINE HCL 1 % IJ SOLN
20.00 | INTRAMUSCULAR | Status: DC
Start: ? — End: 2020-01-26

## 2020-01-26 MED ORDER — LACTATED RINGERS IV SOLN
125.00 | INTRAVENOUS | Status: DC
Start: ? — End: 2020-01-26

## 2020-01-26 MED ORDER — NIFEDIPINE 10 MG PO CAPS
10.00 | ORAL_CAPSULE | ORAL | Status: DC
Start: ? — End: 2020-01-26

## 2020-01-26 MED ORDER — TERBUTALINE SULFATE 1 MG/ML IJ SOLN
0.25 | INTRAMUSCULAR | Status: DC
Start: ? — End: 2020-01-26

## 2020-01-26 MED ORDER — LABETALOL HCL 5 MG/ML IV SOLN
20.00 | INTRAVENOUS | Status: DC
Start: ? — End: 2020-01-26

## 2020-01-26 MED ORDER — GENERIC EXTERNAL MEDICATION
12.50 | Status: DC
Start: ? — End: 2020-01-26

## 2020-01-26 MED ORDER — GENERIC EXTERNAL MEDICATION
1.00 | Status: DC
Start: 2020-01-26 — End: 2020-01-26

## 2020-01-26 MED ORDER — FLUOXETINE HCL 20 MG PO CAPS
60.00 | ORAL_CAPSULE | ORAL | Status: DC
Start: 2020-01-27 — End: 2020-01-26

## 2020-03-15 ENCOUNTER — Ambulatory Visit: Payer: Self-pay | Attending: Internal Medicine

## 2020-03-15 DIAGNOSIS — Z23 Encounter for immunization: Secondary | ICD-10-CM

## 2020-04-08 ENCOUNTER — Ambulatory Visit: Payer: Self-pay | Attending: Internal Medicine

## 2020-04-08 DIAGNOSIS — Z23 Encounter for immunization: Secondary | ICD-10-CM

## 2020-04-08 NOTE — Progress Notes (Signed)
   Covid-19 Vaccination Clinic  Name:  MATSUE STROM    MRN: [id number] DOB: [birth year]  04/08/2020  Ms. Grassel was observed post Covid-19 immunization for 15 minutes without incident. She was provided with Vaccine Information Sheet and instruction to access the V-Safe system.   Ms. Dunaway was instructed to call 911 with any severe reactions post vaccine: Marland Kitchen Difficulty breathing  . Swelling of face and throat  . A fast heartbeat  . A bad rash all over body  . Dizziness and weakness   Immunizations Administered    Name Date Dose VIS Date Route   Pfizer COVID-19 Vaccine 04/08/2020 11:34 AM 0.3 mL 01/31/2019 Intramuscular   Manufacturer: [employer], [employer]   Lot: [id number]   NDC: [address]-3

## 2020-05-24 ENCOUNTER — Other Ambulatory Visit: Payer: Self-pay

## 2020-05-24 ENCOUNTER — Encounter (HOSPITAL_BASED_OUTPATIENT_CLINIC_OR_DEPARTMENT_OTHER): Payer: Self-pay | Admitting: Obstetrics and Gynecology

## 2020-05-24 NOTE — Progress Notes (Signed)
Spoke w/ via phone for pre-op interview---pt Lab needs dos----none               COVID test ------same day rapid arrive 930 am 05-30-2020 Arrive at -------930 am 05-30-2020 No food after midnight, clear liquids until 730 am then npo Medications to take morning of surgery -----prozac, xananx prn Diabetic medication -----n/a Patient Special Instructions -----none Pre-Op special Istructions -----none Patient verbalized understanding of instructions that were given at this phone interview. Patient denies shortness of breath, chest pain, fever, cough a this phone interview.

## 2020-05-24 NOTE — Progress Notes (Signed)
Spoke with patient by phone and made aware I will try to get permission for same day covid test on day of surgery.

## 2020-05-24 NOTE — Progress Notes (Signed)
Spoke with patient by phone and patient stated being unable to quarantine for 05-30-2020 surgery. Patient to call dr Tenny Craw office.

## 2020-05-27 NOTE — Progress Notes (Signed)
[name] to make Lauren Hunter aware patient has tyo be same day rapid covid arrive 930 am due to lives in [place].

## 2020-05-29 ENCOUNTER — Other Ambulatory Visit: Payer: Self-pay | Admitting: Obstetrics and Gynecology

## 2020-05-30 ENCOUNTER — Other Ambulatory Visit: Payer: Self-pay

## 2020-05-30 ENCOUNTER — Encounter (HOSPITAL_BASED_OUTPATIENT_CLINIC_OR_DEPARTMENT_OTHER)
Admission: RE | Disposition: A | Discharge status: Home / Self Care | Payer: Self-pay | Source: Home / Self Care | Attending: Obstetrics and Gynecology

## 2020-05-30 ENCOUNTER — Ambulatory Visit (HOSPITAL_BASED_OUTPATIENT_CLINIC_OR_DEPARTMENT_OTHER): Payer: Managed Care, Other (non HMO) | Admitting: Anesthesiology

## 2020-05-30 ENCOUNTER — Ambulatory Visit (HOSPITAL_BASED_OUTPATIENT_CLINIC_OR_DEPARTMENT_OTHER)
Admission: RE | Admit: 2020-05-30 | Discharge: 2020-05-30 | Disposition: A | Discharge status: Home / Self Care | Payer: Managed Care, Other (non HMO) | Attending: Obstetrics and Gynecology | Admitting: Obstetrics and Gynecology

## 2020-05-30 ENCOUNTER — Encounter (HOSPITAL_BASED_OUTPATIENT_CLINIC_OR_DEPARTMENT_OTHER): Payer: Self-pay | Admitting: Obstetrics and Gynecology

## 2020-05-30 DIAGNOSIS — O021 Missed abortion: Secondary | ICD-10-CM | POA: Diagnosis not present

## 2020-05-30 DIAGNOSIS — Z79890 Hormone replacement therapy (postmenopausal): Secondary | ICD-10-CM | POA: Insufficient documentation

## 2020-05-30 DIAGNOSIS — Z6830 Body mass index (BMI) 30.0-30.9, adult: Secondary | ICD-10-CM | POA: Insufficient documentation

## 2020-05-30 DIAGNOSIS — Z8616 Personal history of COVID-19: Secondary | ICD-10-CM | POA: Insufficient documentation

## 2020-05-30 DIAGNOSIS — Z79899 Other long term (current) drug therapy: Secondary | ICD-10-CM | POA: Insufficient documentation

## 2020-05-30 DIAGNOSIS — Z20822 Contact with and (suspected) exposure to covid-19: Secondary | ICD-10-CM | POA: Diagnosis not present

## 2020-05-30 DIAGNOSIS — F419 Anxiety disorder, unspecified: Secondary | ICD-10-CM | POA: Diagnosis not present

## 2020-05-30 DIAGNOSIS — E669 Obesity, unspecified: Secondary | ICD-10-CM | POA: Diagnosis not present

## 2020-05-30 HISTORY — PX: DILATION AND EVACUATION: SHX1459

## 2020-05-30 LAB — SARS CORONAVIRUS 2 BY RT PCR (HOSPITAL ORDER, PERFORMED IN ~~LOC~~ HOSPITAL LAB): SARS Coronavirus 2: NEGATIVE

## 2020-05-30 SURGERY — DILATION AND EVACUATION, UTERUS
Anesthesia: General | Site: Vagina

## 2020-05-30 MED ORDER — PROMETHAZINE HCL 25 MG/ML IJ SOLN: 6.2500 mg | INTRAMUSCULAR | Status: DC | PRN

## 2020-05-30 MED ORDER — DEXAMETHASONE SODIUM PHOSPHATE 10 MG/ML IJ SOLN
INTRAMUSCULAR | Status: AC
  Filled 2020-05-30: qty 1

## 2020-05-30 MED ORDER — LIDOCAINE 2% (20 MG/ML) 5 ML SYRINGE
INTRAMUSCULAR | Status: DC | PRN
  Administered 2020-05-30: 80 mg via INTRAVENOUS

## 2020-05-30 MED ORDER — FENTANYL CITRATE (PF) 100 MCG/2ML IJ SOLN: 25.0000 ug | INTRAMUSCULAR | Status: DC | PRN

## 2020-05-30 MED ORDER — PROPOFOL 10 MG/ML IV BOLUS
INTRAVENOUS | Status: DC | PRN
  Administered 2020-05-30: 170 mg via INTRAVENOUS
  Administered 2020-05-30: 50 mg via INTRAVENOUS

## 2020-05-30 MED ORDER — FENTANYL CITRATE (PF) 100 MCG/2ML IJ SOLN
INTRAMUSCULAR | Status: AC
  Filled 2020-05-30: qty 2

## 2020-05-30 MED ORDER — LACTATED RINGERS IV SOLN: INTRAVENOUS | Status: DC

## 2020-05-30 MED ORDER — LACTATED RINGERS IV SOLN
INTRAVENOUS | Status: DC
  Administered 2020-05-30: 1000 mL via INTRAVENOUS

## 2020-05-30 MED ORDER — FENTANYL CITRATE (PF) 100 MCG/2ML IJ SOLN
INTRAMUSCULAR | Status: DC | PRN
  Administered 2020-05-30 (×2): 50 ug via INTRAVENOUS

## 2020-05-30 MED ORDER — HYDROCODONE-ACETAMINOPHEN 5-325 MG PO TABS: ORAL_TABLET | ORAL | 0 refills | Status: AC

## 2020-05-30 MED ORDER — DEXAMETHASONE SODIUM PHOSPHATE 10 MG/ML IJ SOLN
INTRAMUSCULAR | Status: DC | PRN
  Administered 2020-05-30: 10 mg via INTRAVENOUS

## 2020-05-30 MED ORDER — ONDANSETRON HCL 4 MG/2ML IJ SOLN
INTRAMUSCULAR | Status: DC | PRN
  Administered 2020-05-30: 4 mg via INTRAVENOUS

## 2020-05-30 MED ORDER — KETOROLAC TROMETHAMINE 30 MG/ML IJ SOLN
INTRAMUSCULAR | Status: AC
  Filled 2020-05-30: qty 1

## 2020-05-30 MED ORDER — LIDOCAINE HCL 1 % IJ SOLN
INTRAMUSCULAR | Status: DC | PRN
  Administered 2020-05-30: 10 mL

## 2020-05-30 MED ORDER — ACETAMINOPHEN 500 MG PO TABS
1000.0000 mg | ORAL_TABLET | Freq: Once | ORAL | Status: AC
  Administered 2020-05-30: 1000 mg via ORAL

## 2020-05-30 MED ORDER — KETOROLAC TROMETHAMINE 30 MG/ML IJ SOLN
INTRAMUSCULAR | Status: DC | PRN
  Administered 2020-05-30: 30 mg via INTRAVENOUS

## 2020-05-30 MED ORDER — MIDAZOLAM HCL 2 MG/2ML IJ SOLN
INTRAMUSCULAR | Status: AC
  Filled 2020-05-30: qty 2

## 2020-05-30 MED ORDER — LIDOCAINE 2% (20 MG/ML) 5 ML SYRINGE
INTRAMUSCULAR | Status: AC
  Filled 2020-05-30: qty 5

## 2020-05-30 MED ORDER — PROPOFOL 10 MG/ML IV BOLUS
INTRAVENOUS | Status: AC
  Filled 2020-05-30: qty 20

## 2020-05-30 MED ORDER — IBUPROFEN 600 MG PO TABS: 600.0000 mg | ORAL_TABLET | Freq: Four times a day (QID) | ORAL | 0 refills | Status: AC | PRN

## 2020-05-30 MED ORDER — MIDAZOLAM HCL 5 MG/5ML IJ SOLN
INTRAMUSCULAR | Status: DC | PRN
  Administered 2020-05-30: 2 mg via INTRAVENOUS

## 2020-05-30 MED ORDER — ACETAMINOPHEN 500 MG PO TABS
ORAL_TABLET | ORAL | Status: AC
  Filled 2020-05-30: qty 2

## 2020-05-30 MED ORDER — ONDANSETRON HCL 4 MG/2ML IJ SOLN
INTRAMUSCULAR | Status: AC
  Filled 2020-05-30: qty 2

## 2020-05-30 SURGICAL SUPPLY — 24 items
BRIEF STRETCH FOR OB PAD XXL (UNDERPADS AND DIAPERS) ×4 IMPLANT
CATH ROBINSON RED A/P 14FR (CATHETERS) ×4 IMPLANT
DECANTER SPIKE VIAL GLASS SM (MISCELLANEOUS) ×4 IMPLANT
DILATOR CANAL MILEX (MISCELLANEOUS) IMPLANT
FILTER UTR ASPR ASSEMBLY (MISCELLANEOUS) ×3 IMPLANT
GLOVE BIO SURGEON STRL SZ7 (GLOVE) ×4 IMPLANT
GLOVE BIOGEL PI IND STRL 7.0 (GLOVE) ×2 IMPLANT
GLOVE BIOGEL PI INDICATOR 7.0 (GLOVE) ×2
GOWN STRL REUS W/ TWL LRG LVL3 (GOWN DISPOSABLE) ×4 IMPLANT
GOWN STRL REUS W/TWL LRG LVL3 (GOWN DISPOSABLE) ×8
HOSE CONNECTING 18IN BERKELEY (TUBING) ×3 IMPLANT
KIT BERKELEY 1ST TRI 3/8 NO TR (MISCELLANEOUS) ×4 IMPLANT
KIT BERKELEY 1ST TRIMESTER 3/8 (MISCELLANEOUS) ×4 IMPLANT
NS IRRIG 1000ML POUR BTL (IV SOLUTION) ×4 IMPLANT
PACK VAGINAL MINOR WOMEN LF (CUSTOM PROCEDURE TRAY) ×4 IMPLANT
PAD OB MATERNITY 4.3X12.25 (PERSONAL CARE ITEMS) ×4 IMPLANT
PAD PREP 24X48 CUFFED NSTRL (MISCELLANEOUS) ×4 IMPLANT
SET BERKELEY SUCTION TUBING (SUCTIONS) ×4 IMPLANT
SOL TOPI POVIDONE IODINE PAINT (MISCELLANEOUS) ×4 IMPLANT
TRAP TISSUE FILTER (MISCELLANEOUS) ×3 IMPLANT
VACURETTE 10 RIGID CVD (CANNULA) IMPLANT
VACURETTE 7MM CVD STRL WRAP (CANNULA) ×3 IMPLANT
VACURETTE 8 RIGID CVD (CANNULA) IMPLANT
VACURETTE 9 RIGID CVD (CANNULA) IMPLANT

## 2020-05-30 NOTE — Op Note (Signed)
Pre-Operative Diagnosis: 1) 6 week missed miscariage Postoperative Diagnosis: 1) 6 week missed miscarriage Procedure: Suction dilation and evacuation Surgeon: Dr. Waynard Reeds Assistant: none Operative Findings: POCs Specimen: POCs EBL: Minimal  Ms. Weldon Is a 24 year old gravida 6 para 3023 who presents for definitive surgical management for6 week missed miscariage . Please see the patient's history and physical for complete details of the history. Management options were discussed with the patient. R/B/A reviewed. Following appropriate informed consent was taken to the operating room. The patient was appropriately identified during a time out procedure. General LMA anesthesia was administered and the patient was placed in the dorsal lithotomy position. The patient was prepped and draped in the normal sterile fashion. A speculum was placed into the vagina, a single-tooth tenaculum was placed on the anterior lip of the cervix, and 10 cc of 1% lidocaine was administered in a paracervical fashion. The cervix was serially dilated with Hank dilators. A #7 suction curet was then passed to the fundus, the vacuum was engaged, and 3 suction passes were performed with a curette. A Sharp curettage was performed and a gritty texture was noted. A final suction pass was performed with minimal results. This completed the procedure. The patient tolerated the procedure well was brought to the recovery room in stable condition for the procedure. All sponge and needle counts correct.

## 2020-05-30 NOTE — H&P (Signed)
Lauren Hunter is an 24 y.o. female.  Lauren Hunter is a 24 y.o. female presenting for missed miscarriage  24 yo [phone] @ 8 weeks by LMP with unplanned pregnancy was seen in the office for her first prenatal visit and was noted to have a missed miscarriage on   [place]. TVUS: AV uterus with intrauterine gestational sac measuring 31mm. Within the gestational sac is an enlarged sac measuring 16 mm. THis may represent a grossly enlarged yolk sac vs the amniotic sac. No FP visualized. The appearance of the gestational sac is abnormal and may be collapsing. No FF in CDS. Left ovary normal appearing. Right ovary with simple cyst and paratubal cyst. No doppler interegation performed  Management options discussed. R/B/A reviewed. Pt desires surgical management   Patient's last menstrual period was 03/25/2019.    Past Medical History:  Diagnosis Date  . Allergy   . Anxiety   . COVID-19 10/2019   COUGH, HEADACHE LOSS OF TASTE AND SMELL, RESIDUAL PNEUMONIA ALL SYMPTOMS RESOLVED IN 1 MONTH  . Scoliosis     Past Surgical History:  Procedure Laterality Date  . DILATION AND EVACUATION N/A 03/27/2019   Procedure: DILATATION AND EVACUATION;  Surgeon: Waynard Reeds, MD;  Location: [facility];  Service: Gynecology;  Laterality: N/A;  . INCISION AND DRAINAGE     MRSA related R thumb 2009 and L toe 2006  . Scoliosis-[place] Rods      Family History  Problem Relation Age of Onset  . Crohn's disease Mother   . Heart failure Mother   . Diabetes Paternal Grandmother   . Diabetes Paternal Grandfather     Social History:  reports that she has never smoked. She has never used smokeless tobacco. She reports that she does not drink alcohol and does not use drugs.  Allergies:  Allergies  Allergen Reactions  . Codeine Swelling and Rash    Medications Prior to Admission  Medication Sig Dispense Refill Last Dose  . ALPRAZolam (XANAX) 0.25 MG tablet Take 0.25 mg by mouth as needed for  anxiety.   05/29/2020 at Unknown time  . FLUoxetine (PROZAC) 40 MG capsule Take 40 mg by mouth 2 (two) times daily.   05/29/2020 at Unknown time  . levothyroxine (SYNTHROID) 50 MCG tablet Take 50 mcg by mouth daily before breakfast.   05/29/2020 at Unknown time  . hydrOXYzine (ATARAX/VISTARIL) 10 MG tablet Take 10 mg by mouth 3 (three) times daily as needed.   More than a month at Unknown time    Review of Systems  Blood pressure 123/71, pulse 72, temperature 98.9 F (37.2 C), resp. rate 16, height 5\' 4"  (1.626 m), weight 80.6 kg, last menstrual period 03/25/2019, SpO2 100 %, unknown if currently breastfeeding. Physical Exam   AOx3. NAD Normal work of breathing Abd soft  BT Rh positive  Results for orders placed or performed during the hospital encounter of 05/30/20 (from the past 24 hour(s))  SARS Coronavirus 2 by RT PCR (hospital order, performed in [facility] hospital lab) Nasopharyngeal Nasopharyngeal Swab     Status: None   Collection Time: 05/30/20  9:39 AM   Specimen: Nasopharyngeal Swab  Result Value Ref Range   SARS Coronavirus 2 NEGATIVE NEGATIVE    No results found.  Assessment/Plan: 1) Proceed with suction D&E. R/B/A reviewed. Informed consent obtaines 2) SCDs 3) Will not perform chromosome studies d/t pt does not desire future fertility  06/01/20 05/30/2020, 12:51 PM

## 2020-05-30 NOTE — Anesthesia Postprocedure Evaluation (Signed)
Anesthesia Post Note  Patient: Lauren Hunter  Procedure(s) Performed: DILATATION AND EVACUATION (N/A Vagina )     Patient location during evaluation: PACU Anesthesia Type: General Level of consciousness: awake and alert, oriented and awake Pain management: pain level controlled Vital Signs Assessment: post-procedure vital signs reviewed and stable Respiratory status: spontaneous breathing, nonlabored ventilation, respiratory function stable and patient connected to nasal cannula oxygen Cardiovascular status: blood pressure returned to baseline and stable Postop Assessment: no apparent nausea or vomiting Anesthetic complications: no   No complications documented.  Last Vitals:  Vitals:   05/30/20 1400 05/30/20 1415  BP: 115/69 115/72  Pulse: 65 63  Resp: 14 18  Temp:    SpO2: 100% 100%    Last Pain:  Vitals:   05/30/20 1400  PainSc: 0-No pain                 Cecile Hearing

## 2020-05-30 NOTE — Anesthesia Preprocedure Evaluation (Signed)
Anesthesia Evaluation  Patient identified by MRN, date of birth, ID band Patient awake    Reviewed: Allergy & Precautions, NPO status , Patient's Chart, lab work & pertinent test results  Airway Mallampati: II  TM Distance: >3 FB Neck ROM: Full    Dental  (+) Teeth Intact, Dental Advisory Given   Pulmonary neg pulmonary ROS,    Pulmonary exam normal breath sounds clear to auscultation       Cardiovascular negative cardio ROS Normal cardiovascular exam Rhythm:Regular Rate:Normal     Neuro/Psych PSYCHIATRIC DISORDERS Anxiety negative neurological ROS     GI/Hepatic negative GI ROS, Neg liver ROS,   Endo/Other  Obesity   Renal/GU negative Renal ROS     Musculoskeletal negative musculoskeletal ROS (+)   Abdominal   Peds  Hematology negative hematology ROS (+)   Anesthesia Other Findings Day of surgery medications reviewed with the patient.  Reproductive/Obstetrics 6 weeks missed abortion                              Anesthesia Physical Anesthesia Plan  ASA: II  Anesthesia Plan: MAC   Post-op Pain Management:    Induction: Intravenous  PONV Risk Score and Plan: 2 and Propofol infusion, Treatment may vary due to age or medical condition and Midazolam  Airway Management Planned: Nasal Cannula and Natural Airway  Additional Equipment:   Intra-op Plan:   Post-operative Plan:   Informed Consent: I have reviewed the patients History and Physical, chart, labs and discussed the procedure including the risks, benefits and alternatives for the proposed anesthesia with the patient or authorized representative who has indicated his/her understanding and acceptance.     Dental advisory given  Plan Discussed with: CRNA and Anesthesiologist  Anesthesia Plan Comments:         Anesthesia Quick Evaluation

## 2020-05-30 NOTE — Anesthesia Procedure Notes (Signed)
Procedure Name: LMA Insertion Date/Time: 05/30/2020 1:01 PM Performed by: Marny Lowenstein, CRNA Pre-anesthesia Checklist: Patient identified, Emergency Drugs available, Suction available and Patient being monitored Patient Re-evaluated:Patient Re-evaluated prior to induction Oxygen Delivery Method: Circle system utilized Preoxygenation: Pre-oxygenation with 100% oxygen Induction Type: IV induction Ventilation: Mask ventilation without difficulty LMA: LMA inserted LMA Size: 4.0 Number of attempts: 1 Placement Confirmation: positive ETCO2 and breath sounds checked- equal and bilateral Tube secured with: Tape Dental Injury: Teeth and Oropharynx as per pre-operative assessment

## 2020-05-30 NOTE — Discharge Instructions (Signed)
Dilation and Curettage or Vacuum Curettage, Care After These instructions give you information about caring for yourself after your procedure. Your doctor may also give you more specific instructions. Call your doctor if you have any problems or questions after your procedure. Follow these instructions at home: Activity  Do not drive or use heavy machinery while taking prescription pain medicine.  For 24 hours after your procedure, avoid driving.  Take short walks often, followed by rest periods. Ask your doctor what activities are safe for you. After one or two days, you may be able to return to your normal activities.  Do not lift anything that is heavier than 10 lb (4.5 kg) until your doctor approves.  For at least 2 weeks, or as long as told by your doctor: ? Do not douche. ? Do not use tampons. ? Do not have sex. General instructions   Take over-the-counter and prescription medicines only as told by your doctor. This is very important if you take blood thinning medicine.  Do not take baths, swim, or use a hot tub until your doctor approves. Take showers instead of baths.  Wear compression stockings as told by your doctor.  It is up to you to get the results of your procedure. Ask your doctor when your results will be ready.  Keep all follow-up visits as told by your doctor. This is important. Contact a doctor if:  You have very bad cramps that get worse or do not get better with medicine.  You have very bad pain in your belly (abdomen).  You cannot drink fluids without throwing up (vomiting).  You get pain in a different part of the area between your belly and thighs (pelvis).  You have bad-smelling discharge from your vagina.  You have a rash. Get help right away if:  You are bleeding a lot from your vagina. A lot of bleeding means soaking more than one sanitary pad in an hour, for 2 hours in a row.  You have clumps of blood (blood clots) coming from your  vagina.  You have a fever or chills.  Your belly feels very tender or hard.  You have chest pain.  You have trouble breathing.  You cough up blood.  You feel dizzy.  You feel light-headed.  You pass out (faint).  You have pain in your neck or shoulder area. Summary  Take short walks often, followed by rest periods. Ask your doctor what activities are safe for you. After one or two days, you may be able to return to your normal activities.  Do not lift anything that is heavier than 10 lb (4.5 kg) until your doctor approves.  Do not take baths, swim, or use a hot tub until your doctor approves. Take showers instead of baths.  Contact your doctor if you have any symptoms of infection, like bad-smelling discharge from your vagina. This information is not intended to replace advice given to you by your health care provider. Make sure you discuss any questions you have with your health care provider. Document Revised: 11/05/2017 Document Reviewed: 08/10/2016 Elsevier Patient Education  2020 [employer].    Post Anesthesia Home Care Instructions  Activity: Get plenty of rest for the remainder of the day. A responsible individual must stay with you for 24 hours following the procedure.  For the next 24 hours, DO NOT: -Drive a car -Advertising copywriter -Drink alcoholic beverages -Take any medication unless instructed by your physician -Make any legal decisions or sign important  papers.  Meals: Start with liquid foods such as gelatin or soup. Progress to regular foods as tolerated. Avoid greasy, spicy, heavy foods. If nausea and/or vomiting occur, drink only clear liquids until the nausea and/or vomiting subsides. Call your physician if vomiting continues.  Special Instructions/Symptoms: Your throat may feel dry or sore from the anesthesia or the breathing tube placed in your throat during surgery. If this causes discomfort, gargle with warm salt water. The discomfort should  disappear within 24 hours.  If you had a scopolamine patch placed behind your ear for the management of post- operative nausea and/or vomiting:  1. The medication in the patch is effective for 72 hours, after which it should be removed.  Wrap patch in a tissue and discard in the trash. Wash hands thoroughly with soap and water. 2. You may remove the patch earlier than 72 hours if you experience unpleasant side effects which may include dry mouth, dizziness or visual disturbances. 3. Avoid touching the patch. Wash your hands with soap and water after contact with the patch.  Next dose of tylenol after 6:30 pm this evening.

## 2020-05-30 NOTE — Transfer of Care (Signed)
Immediate Anesthesia Transfer of Care Note  Patient: Lauren Hunter  Procedure(s) Performed: DILATATION AND EVACUATION (N/A Vagina )  Patient Location: PACU  Anesthesia Type:General  Level of Consciousness: drowsy and patient cooperative  Airway & Oxygen Therapy: Patient Spontanous Breathing and Patient connected to face mask oxygen  Post-op Assessment: Report given to RN and Post -op Vital signs reviewed and stable  Post vital signs: Reviewed and stable  Last Vitals:  Vitals Value Taken Time  BP 122/79 05/30/20 1336  Temp    Pulse 86 05/30/20 1342  Resp 12 05/30/20 1342  SpO2 100 % 05/30/20 1342  Vitals shown include unvalidated device data.  Last Pain:  Vitals:   05/30/20 1215  PainSc: 2       Patients Stated Pain Goal: 6 (05/30/20 1215)  Complications: No complications documented.

## 2020-05-31 ENCOUNTER — Encounter (HOSPITAL_BASED_OUTPATIENT_CLINIC_OR_DEPARTMENT_OTHER): Payer: Self-pay | Admitting: Obstetrics and Gynecology

## 2020-05-31 LAB — SURGICAL PATHOLOGY
